# Patient Record
Sex: Male | Born: 1965
Health system: Southern US, Community
[De-identification: ages and names within clinical notes are randomized; demographics above are authoritative.]

## PROBLEM LIST (undated history)

## (undated) DIAGNOSIS — C4492 Squamous cell carcinoma of skin, unspecified: Secondary | ICD-10-CM

## (undated) DIAGNOSIS — L570 Actinic keratosis: Secondary | ICD-10-CM

## (undated) DIAGNOSIS — N200 Calculus of kidney: Secondary | ICD-10-CM

## (undated) DIAGNOSIS — K219 Gastro-esophageal reflux disease without esophagitis: Secondary | ICD-10-CM

## (undated) DIAGNOSIS — G4730 Sleep apnea, unspecified: Secondary | ICD-10-CM

## (undated) DIAGNOSIS — G4733 Obstructive sleep apnea (adult) (pediatric): Secondary | ICD-10-CM

## (undated) DIAGNOSIS — R12 Heartburn: Secondary | ICD-10-CM

## (undated) HISTORY — DX: Actinic keratosis: L57.0

## (undated) HISTORY — DX: Heartburn: R12

## (undated) HISTORY — DX: Gastro-esophageal reflux disease without esophagitis: K21.9

## (undated) HISTORY — PX: UPPER GI ENDOSCOPY: SHX6162

## (undated) HISTORY — DX: Squamous cell carcinoma of skin, unspecified: C44.92

## (undated) HISTORY — PX: COLONOSCOPY: SHX5424

## (undated) HISTORY — DX: Sleep apnea, unspecified: G47.30

## (undated) HISTORY — PX: OTHER SURGICAL HISTORY: SHX169

## (undated) HISTORY — PX: CHOLECYSTECTOMY: SHX55

## (undated) HISTORY — DX: Obstructive sleep apnea (adult) (pediatric): G47.33

## (undated) HISTORY — DX: Calculus of kidney: N20.0

---

## 2003-12-12 ENCOUNTER — Encounter: Payer: Self-pay | Admitting: Family Medicine

## 2003-12-12 LAB — CONVERTED CEMR LAB
Blood Glucose, Fasting: 91 mg/dL
RBC count: 5.61 10*6/uL

## 2004-12-18 ENCOUNTER — Ambulatory Visit: Payer: Self-pay | Admitting: Family Medicine

## 2005-11-17 ENCOUNTER — Ambulatory Visit: Payer: Self-pay | Admitting: Family Medicine

## 2006-05-19 ENCOUNTER — Ambulatory Visit: Payer: Self-pay | Admitting: Family Medicine

## 2007-07-29 ENCOUNTER — Ambulatory Visit: Payer: Self-pay | Admitting: Family Medicine

## 2007-07-29 DIAGNOSIS — E782 Mixed hyperlipidemia: Secondary | ICD-10-CM | POA: Insufficient documentation

## 2007-07-29 DIAGNOSIS — K219 Gastro-esophageal reflux disease without esophagitis: Secondary | ICD-10-CM | POA: Insufficient documentation

## 2007-08-04 ENCOUNTER — Encounter: Payer: Self-pay | Admitting: Family Medicine

## 2007-09-09 ENCOUNTER — Ambulatory Visit: Payer: Self-pay | Admitting: Family Medicine

## 2007-09-27 ENCOUNTER — Ambulatory Visit: Payer: Self-pay | Admitting: Family Medicine

## 2007-09-27 DIAGNOSIS — B029 Zoster without complications: Secondary | ICD-10-CM | POA: Insufficient documentation

## 2007-09-27 DIAGNOSIS — L821 Other seborrheic keratosis: Secondary | ICD-10-CM | POA: Insufficient documentation

## 2007-10-06 ENCOUNTER — Ambulatory Visit: Payer: Self-pay | Admitting: Family Medicine

## 2007-10-06 DIAGNOSIS — K645 Perianal venous thrombosis: Secondary | ICD-10-CM | POA: Insufficient documentation

## 2008-05-03 ENCOUNTER — Ambulatory Visit: Payer: Self-pay | Admitting: Family Medicine

## 2008-05-03 DIAGNOSIS — J069 Acute upper respiratory infection, unspecified: Secondary | ICD-10-CM | POA: Insufficient documentation

## 2008-05-03 DIAGNOSIS — K120 Recurrent oral aphthae: Secondary | ICD-10-CM | POA: Insufficient documentation

## 2008-05-17 ENCOUNTER — Telehealth: Payer: Self-pay | Admitting: Family Medicine

## 2009-12-10 ENCOUNTER — Ambulatory Visit: Payer: Self-pay | Admitting: Family Medicine

## 2009-12-10 DIAGNOSIS — R82998 Other abnormal findings in urine: Secondary | ICD-10-CM | POA: Insufficient documentation

## 2009-12-10 LAB — CONVERTED CEMR LAB
Bilirubin Urine: NEGATIVE
Blood in Urine, dipstick: NEGATIVE
Ketones, urine, test strip: NEGATIVE
Nitrite: NEGATIVE
pH: 5

## 2009-12-11 ENCOUNTER — Encounter: Payer: Self-pay | Admitting: Family Medicine

## 2010-01-08 ENCOUNTER — Encounter (INDEPENDENT_AMBULATORY_CARE_PROVIDER_SITE_OTHER): Payer: Self-pay | Admitting: *Deleted

## 2010-07-02 NOTE — Assessment & Plan Note (Signed)
Summary: TRANSFER FROM DR SCHALLER/BLOOD IN   Vital Signs:  Patient profile:   45 year old male Height:      74 inches Weight:      245.75 pounds BMI:     31.67 Temp:     98.3 degrees F oral Pulse rate:   80 / minute Pulse rhythm:   regular BP sitting:   112 / 88  (left arm) Cuff size:   large  Vitals Entered By: Delilah Shan CMA Khamya Topp Dull) (12/13/09 3:53 PM) CC: Transfer from [place] - Blood in urine   History of Present Illness: Had DOT physical and was fine except for small amount of protein and blood in urine.  Had been having some diarrhea (wife had similar).   Feeling well o/w.  No gross hematuria.  No fevers, chills, vomiting.  No h/o kidney stones.  No other complaints.  Multiple prev DOT physicals w/o any abnormality on lab testing.   Allergies: No Known Drug Allergies  Past History:  Family History: Last updated: 12-13-09 Father: Dead MI X68 ; +HTN, CHF Mother: Alive Siblings: 2 BROTHERS 1 SISTER CV: + FATHER MI X2 HBP: + FATHER DM: NEGATIVE GOUT/ARTHRITIS: PROSTATE CANCER:  BREAST/OVARIAN/UTERINE CANCER: COLON CANCER: DEPRESSION: + BROTHER ETOH/DRUG ABUSE: NEGATIVE OTHER: + STROKE IN FAMILY  Social History: Last updated: 12/13/2009 Marital Status: Married// REMARRIED LIVES WITH WIFE Children: 1CHILD FROM PREVIOUS MARRIAGE LIVES WITH MOTHER Occupation: TRUCKDRIVER Stopped smoking 2007 Occ alcohol on weekend   Risk Factors: Smoking Status: quit (07/29/2007) Packs/Day: 05/2007 (08/04/2007)  Past Medical History: Heartburn, treated with ranitidine  Family History: Father: Dead MI X74 ; +HTN, CHF Mother: Alive Siblings: 2 BROTHERS 1 SISTER CV: + FATHER MI X2 HBP: + FATHER DM: NEGATIVE GOUT/ARTHRITIS: PROSTATE CANCER:  BREAST/OVARIAN/UTERINE CANCER: COLON CANCER: DEPRESSION: + BROTHER ETOH/DRUG ABUSE: NEGATIVE OTHER: + STROKE IN FAMILY  Social History: Marital Status: Married// REMARRIED LIVES WITH WIFE Children: 1CHILD FROM PREVIOUS  MARRIAGE LIVES WITH MOTHER Occupation: TRUCKDRIVER Stopped smoking 2007 Occ alcohol on weekend   Review of Systems       See HPI.  Otherwise noncontributory.    Physical Exam  General:  GEN: nad, alert and oriented HEENT: mucous membranes moist NECK: supple w/o LA CV: rrr.  no murmur PULM: ctab, no inc wob ABD: soft, +bs EXT: no edema SKIN: no acute rash    Impression & Recommendations:  Problem # 1:  OTHER NONSPECIFIC FINDING EXAMINATION OF URINE (ICD-791.9) Likely false positive.  Check urine cx and contact with results.  If +, treat.  If neg, no other work up needed.  D/w patient.  Ua reviewed.  No h/o DM, no reason to suspected other systemic disease.  Orders: T-Urine Culture (Spectrum Order) [id number]) UA Dipstick w/o Micro (manual) (09811) Specimen Handling (99000)  Complete Medication List: 1)  Zantac 150 Mg Tabs (Ranitidine hcl) .Marland Kitchen.. 1 tablet two times a day  Patient Instructions: 1)  Please schedule a follow-up appointment as needed .  I'll send word about your urine culture.   Current Allergies (reviewed today): No known allergies   Laboratory Results   Urine Tests  Date/Time Received: December 13, 2009 4:07 PM   Routine Urinalysis   Color: yellow Appearance: Clear Glucose: negative   (Normal Range: Negative) Bilirubin: negative   (Normal Range: Negative) Ketone: negative   (Normal Range: Negative) Spec. Gravity: >=1.030   (Normal Range: 1.003-1.035) Blood: negative   (Normal Range: Negative) pH: 5.0   (Normal Range: 5.0-8.0) Protein: negative   (Normal Range:  Negative) Urobilinogen: 0.2   (Normal Range: 0-1) Nitrite: negative   (Normal Range: Negative) Leukocyte Esterace: negative   (Normal Range: Negative)

## 2010-07-02 NOTE — Letter (Signed)
Summary: Gregory Valencia letter  [place] [facility]  [address] [place], [place] [address]   Phone: [phone]  Fax: [phone]       01/08/2010 MRN: [id number]  [facility] [address] [place], [place]  [address]  Dear Gregory Valencia,  [facility] [place], and [facility] announce the retirement of Gregory Valencia, M.D., from full-time practice at the [facility] office effective November 29, 2009 and his plans of returning part-time.  It is important to Gregory Valencia and to our practice that you understand that [facility] has seven physicians in our office for your health care needs.  We will continue to offer the same exceptional care that you have today.    Gregory Valencia has spoken to many of you about his plans for retirement and returning part-time in the fall.   We will continue to work with you through the transition to schedule appointments for you in the office and meet the high standards that [facility] is committed to.   Again, it is with great pleasure that we share the news that Gregory Valencia will return to [facility] at [facility] in October of 2011 with a reduced schedule.    If you have any questions, or would like to request an appointment with one of our physicians, please call us at [phone] and press the option for Scheduling an appointment.  We take pleasure in providing you with excellent patient care and look forward to seeing you at your next office visit.  Our [facility] Physicians are:  Gregory Valencia, M.D. Gregory Valencia, M.D. Gregory Valencia, M.D. Gregory Valencia, M.D. Gregory Valencia, M.D. Gregory Valencia, M.D. We proudly welcomed Gregory Valencia, M.D. and Gregory Valencia, M.D. to the practice in July/August 2011.  Sincerely,  [facility] [place] [facility]

## 2011-04-09 ENCOUNTER — Encounter: Payer: Self-pay | Admitting: Family Medicine

## 2011-04-09 ENCOUNTER — Ambulatory Visit (INDEPENDENT_AMBULATORY_CARE_PROVIDER_SITE_OTHER): Payer: BC Managed Care – PPO | Admitting: Family Medicine

## 2011-04-09 DIAGNOSIS — J069 Acute upper respiratory infection, unspecified: Secondary | ICD-10-CM

## 2011-04-09 DIAGNOSIS — L723 Sebaceous cyst: Secondary | ICD-10-CM

## 2011-04-09 NOTE — Assessment & Plan Note (Signed)
Recurrent, poss early flu. See instructions.

## 2011-04-09 NOTE — Patient Instructions (Signed)
Take Guaifenesin (400mg ), take 11/2 tabs by mouth AM and NOON. Get GUAIFENESIN by  going to [facility] or [facility] and getting MUCOUS RELIEF EXPECTORANT/CONGESTION. DO NOT GET MUCINEX (Timed Release Guaifenesin) Drink lots of fluids, clears best. Take Tyl 500mg  2 three times a day. Keep lozenge in mouth all day. BRAT today and tomorrow.

## 2011-04-09 NOTE — Assessment & Plan Note (Signed)
Chronic of back that has been there for years. Is slowly getting larger. He wants rtemoved. Will try to do it here in the office. Is approx 3-4 cm in diam.

## 2011-04-09 NOTE — Progress Notes (Signed)
  Subjective:    Patient ID: Gregory Valencia, male    DOB: 1966/03/29, 45 y.o.   MRN: [id number]  HPI Pt of mine not seen very often here today as acute appt for URI. He has had fever to 101.5 this AM and he has clod sweats. He has hewadache in the frontal distribution. No ear pain, noST, no rhinitis, chest congestion with cough occasionally thsa tgetds bad episodically that is poorly productive with yellow mucous. He ate at a [employer] last night for soup which was mostly fat and he finally threw up. He had grits.     Review of SystemsNoncontributory except as above.       Objective:   Physical Exam  Constitutional: He is oriented to person, place, and time. He appears well-developed and well-nourished. No distress.       Mildly lethargic.  HENT:  Head: Normocephalic and atraumatic.  Right Ear: External ear normal.  Left Ear: External ear normal.  Nose: Nose normal.  Mouth/Throat: Oropharynx is clear and moist.  Eyes: Conjunctivae and EOM are normal. Pupils are equal, round, and reactive to light. Right eye exhibits no discharge. Left eye exhibits no discharge.  Neck: Normal range of motion. Neck supple.  Cardiovascular: Normal rate and regular rhythm.   Pulmonary/Chest: Effort normal and breath sounds normal. He has no wheezes.  Abdominal: Soft. Bowel sounds are normal. He exhibits no distension. There is no tenderness. There is no rebound and no guarding.  Lymphadenopathy:    He has no cervical adenopathy.  Neurological: He is alert and oriented to person, place, and time. He has normal reflexes. No cranial nerve deficit. He exhibits normal muscle tone. Coordination normal.  Skin: He is not diaphoretic.          Assessment & Plan:

## 2011-04-11 ENCOUNTER — Ambulatory Visit (INDEPENDENT_AMBULATORY_CARE_PROVIDER_SITE_OTHER): Payer: PRIVATE HEALTH INSURANCE | Admitting: Family Medicine

## 2011-04-11 ENCOUNTER — Encounter: Payer: Self-pay | Admitting: Family Medicine

## 2011-04-11 DIAGNOSIS — R1110 Vomiting, unspecified: Secondary | ICD-10-CM

## 2011-04-11 MED ORDER — ONDANSETRON HCL 4 MG PO TABS: 4.0000 mg | ORAL_TABLET | Freq: Three times a day (TID) | ORAL | Status: AC | PRN

## 2011-04-11 MED ORDER — PROMETHAZINE HCL 25 MG RE SUPP: 25.0000 mg | Freq: Four times a day (QID) | RECTAL | Status: AC | PRN

## 2011-04-11 NOTE — Progress Notes (Signed)
Started with aches on Sunday.  Increased through the week.  Fatigue and now with vomiting x36 hours.  No blood in vomitus.  Initially clear, then brown, then yellow/green.  Some loose stools earlier.  Dec in appetite.  Taking OTC cold meds.  HA for 2-3 days, but no ear pain.  Fever up to 102.  No sick contacts.  Trying to take sips of liquids.  Last UOP this AM.    Meds, vitals, and allergies reviewed.   ROS: See HPI.  Otherwise, noncontributory.  Nad, nontoxic, but looks like he doesn't feel well, ie mildly ill ncat  Mmm, no oral lesions rrr ctab abd soft, not ttp, no masses, no rebound Ext well perfused.

## 2011-04-11 NOTE — Patient Instructions (Signed)
Take the zofran 3 times a day along with sips of liquids.  If you get dizzy on standing or stop making urine, then go to the ER.  Use the phenergan suppository if you can't hold the zofran down.

## 2011-04-13 ENCOUNTER — Encounter: Payer: Self-pay | Admitting: Family Medicine

## 2011-04-13 DIAGNOSIS — R1110 Vomiting, unspecified: Secondary | ICD-10-CM | POA: Insufficient documentation

## 2011-04-13 NOTE — Assessment & Plan Note (Signed)
Nontoxic, not dehydrated, okay for outpatient f/u.  Take sips of liquids, use zofran prn and phenergan suppositories if he can't tolerate oral zofran.  Likely with acute viral process and no need for further w/u at this point.  ddx d/w pt and okay for prn f/u.  He agrees.  Notify MD if dehydrated, blood in stool/vomitus, other concerns.

## 2011-10-17 ENCOUNTER — Encounter: Payer: Self-pay | Admitting: Family Medicine

## 2011-10-17 ENCOUNTER — Ambulatory Visit (INDEPENDENT_AMBULATORY_CARE_PROVIDER_SITE_OTHER): Payer: PRIVATE HEALTH INSURANCE | Admitting: Family Medicine

## 2011-10-17 VITALS — BP 114/74 | HR 72 | Temp 98.4°F | Wt 246.8 lb

## 2011-10-17 DIAGNOSIS — L723 Sebaceous cyst: Secondary | ICD-10-CM

## 2011-10-17 NOTE — Progress Notes (Signed)
"  spots on my back."  3 cm lesion.  Puffy and tender.  Has enlarged per patient. Asking for eval tx.    I&D  Meds, vitals, and allergies reviewed.   Indication: sebaceous cyst, symptomatic  Pt complaints of: pain, swelling  Location: back  Size:3 cm  Informed consent obtained.  Pt aware of risks not limited to but including infection, bleeding, damage to near by organs.  See scanned forms.   Prep: etoh/betadine  Anesthesia: 1%lidocaine with epi, good effect  Incision made with #11 blade  Cyst explored and loculations removed, sebum expressed, fragments of sac removed with forceps.   Wound packed with iodoform gauze  Tolerated well  Routine postprocedure instructions d/w pt- remove packing gradually, keep area clean and bandaged, follow up if concerns/spreading erythema/pain.

## 2011-10-17 NOTE — Patient Instructions (Signed)
Keep the area clean and covered.  Pull some of the packing each day and trim the end.  It should gradually heal.  Take care.

## 2011-10-19 NOTE — Assessment & Plan Note (Signed)
I&D done, tolerated well, see above.  See instructions.

## 2011-12-11 ENCOUNTER — Encounter: Payer: Self-pay | Admitting: Family Medicine

## 2011-12-11 ENCOUNTER — Ambulatory Visit (INDEPENDENT_AMBULATORY_CARE_PROVIDER_SITE_OTHER): Payer: PRIVATE HEALTH INSURANCE | Admitting: Family Medicine

## 2011-12-11 VITALS — BP 120/82 | HR 64 | Temp 98.3°F | Ht 73.5 in | Wt 247.0 lb

## 2011-12-11 DIAGNOSIS — T148XXA Other injury of unspecified body region, initial encounter: Secondary | ICD-10-CM

## 2011-12-11 DIAGNOSIS — T148 Other injury of unspecified body region: Secondary | ICD-10-CM

## 2011-12-11 DIAGNOSIS — W57XXXA Bitten or stung by nonvenomous insect and other nonvenomous arthropods, initial encounter: Secondary | ICD-10-CM | POA: Insufficient documentation

## 2011-12-11 MED ORDER — DOXYCYCLINE HYCLATE 100 MG PO TABS: 100.0000 mg | ORAL_TABLET | Freq: Two times a day (BID) | ORAL | Status: AC

## 2011-12-11 NOTE — Assessment & Plan Note (Signed)
Given the duration and engorged tick, would treat with doxy.  Nontoxic, no systemic sx.  F/u prn.  He agrees, will call back as needed.

## 2011-12-11 NOTE — Progress Notes (Signed)
Engorged tick on R lower abd.  It likely bit him twice, had moved from initial site to a lower site.  Removed, local redness noted in last 48 hours. Tick was on for about ~5 days.  No FCNAVD.  No rash, other than local bite reaction.    Meds, vitals, and allergies reviewed.   ROS: See HPI.  Otherwise, noncontributory.  nad rrr ctab No FB noted, no retained parts noted Local erythema around both bite sites.

## 2011-12-11 NOTE — Patient Instructions (Addendum)
Start the doxycycline today and be careful about sun exposure.

## 2013-06-02 DIAGNOSIS — C4492 Squamous cell carcinoma of skin, unspecified: Secondary | ICD-10-CM

## 2013-06-02 HISTORY — DX: Squamous cell carcinoma of skin, unspecified: C44.92

## 2013-08-03 ENCOUNTER — Encounter: Payer: Self-pay | Admitting: Family Medicine

## 2013-08-03 ENCOUNTER — Ambulatory Visit (INDEPENDENT_AMBULATORY_CARE_PROVIDER_SITE_OTHER): Payer: PRIVATE HEALTH INSURANCE | Admitting: Family Medicine

## 2013-08-03 VITALS — BP 114/78 | HR 64 | Temp 97.9°F | Wt 248.8 lb

## 2013-08-03 DIAGNOSIS — L989 Disorder of the skin and subcutaneous tissue, unspecified: Secondary | ICD-10-CM

## 2013-08-03 NOTE — Patient Instructions (Signed)
No charge for visit.  If you had a copay, then get it back.

## 2013-08-03 NOTE — Progress Notes (Signed)
Pre visit review using our clinic review tool, if applicable. No additional management support is needed unless otherwise documented below in the visit note.  Wanted 2 skin lesions evaluated.  Both on L leg.   Meds, vitals, and allergies reviewed.   ROS: See HPI.  Otherwise, noncontributory.  nad L lateral thigh with 1.5x1.5cm round hyperkeratotic lesion.  No ulceration.  L posterior thigh with skin tag noted.

## 2013-08-04 DIAGNOSIS — L989 Disorder of the skin and subcutaneous tissue, unspecified: Secondary | ICD-10-CM | POA: Insufficient documentation

## 2013-08-04 NOTE — Assessment & Plan Note (Signed)
Refer to derm for excision of the larger lesion.  D/w pt.  He agrees. I can take off the skin tag if derm doesn't do it at the same time.  He agrees. No charge.

## 2014-04-12 ENCOUNTER — Telehealth: Payer: Self-pay | Admitting: Family Medicine

## 2014-04-12 NOTE — Telephone Encounter (Signed)
I can refer him but we should probably get him in at [facility] before we send him to GI. Try to schedule appointment.

## 2014-04-12 NOTE — Telephone Encounter (Signed)
Patient notified as instructed by telephone. Appointment scheduled for 04/14/14.

## 2014-04-12 NOTE — Telephone Encounter (Signed)
Pt is requesting a referral to GI doctor for the reflux that he is having Pt is requesting a call back.

## 2014-04-14 ENCOUNTER — Ambulatory Visit (INDEPENDENT_AMBULATORY_CARE_PROVIDER_SITE_OTHER): Payer: PRIVATE HEALTH INSURANCE | Admitting: Family Medicine

## 2014-04-14 ENCOUNTER — Encounter: Payer: Self-pay | Admitting: Family Medicine

## 2014-04-14 VITALS — BP 122/76 | HR 68 | Temp 98.6°F | Wt 250.8 lb

## 2014-04-14 DIAGNOSIS — R112 Nausea with vomiting, unspecified: Secondary | ICD-10-CM

## 2014-04-14 DIAGNOSIS — M542 Cervicalgia: Secondary | ICD-10-CM

## 2014-04-14 LAB — CBC WITH DIFFERENTIAL/PLATELET
BASOS ABS: 0 10*3/uL (ref 0.0–0.1)
Basophils Relative: 0 % (ref 0–1)
EOS ABS: 0.2 10*3/uL (ref 0.0–0.7)
EOS PCT: 2 % (ref 0–5)
HEMATOCRIT: 44.8 % (ref 39.0–52.0)
Hemoglobin: 15.8 g/dL (ref 13.0–17.0)
LYMPHS PCT: 34 % (ref 12–46)
Lymphs Abs: 3.2 10*3/uL (ref 0.7–4.0)
MCH: 29.1 pg (ref 26.0–34.0)
MCHC: 35.3 g/dL (ref 30.0–36.0)
MCV: 82.5 fL (ref 78.0–100.0)
MONO ABS: 0.8 10*3/uL (ref 0.1–1.0)
Monocytes Relative: 9 % (ref 3–12)
Neutro Abs: 5.1 10*3/uL (ref 1.7–7.7)
Neutrophils Relative %: 55 % (ref 43–77)
PLATELETS: 249 10*3/uL (ref 150–400)
RBC: 5.43 MIL/uL (ref 4.22–5.81)
RDW: 14 % (ref 11.5–15.5)
WBC: 9.3 10*3/uL (ref 4.0–10.5)

## 2014-04-14 MED ORDER — CYCLOBENZAPRINE HCL 10 MG PO TABS: 5.0000 mg | ORAL_TABLET | Freq: Three times a day (TID) | ORAL | Status: DC | PRN

## 2014-04-14 NOTE — Patient Instructions (Addendum)
Gregory Valencia will call about your referral for the ultrasound and the GI appointment.  Go to the lab on the way out.  We'll contact you with your lab report. Avoid greasy food in the meantime.  Use a heating pad on your neck and gently stretch.  Use flexeril if needed.  If can make you drowsy.

## 2014-04-14 NOTE — Progress Notes (Signed)
Pre visit review using our clinic review tool, if applicable. No additional management support is needed unless otherwise documented below in the visit note.  GI sx- ad been on prilosec for about 1 year, more recently changed to nexium.   He has had irregular episodes w/o a clear trigger.  He'll feel fine then he'll get tightness in his chest along with stomach cramps. He'll have to make himself vomit.  He'll tense up and can't have a BM or pass gas, can't burp during an episode.    When he vomits, it will be prev food, then yellowish mucous.   This has been going on "for a long time (ie ~10+ years)".  He can go months between episodes.   He can't always find a trigger for the episodes.    Rare nsaid use.   He never had an endoscopy previously.  No blood in stool but his stools seem be abnormally dark after the episodes.   No BRBPR.  No h/o jaundice.    His mother has a history of similar episodes and she improved after her GB was removed.  No FH colon cancer.  Unclear if greasy food is the trigger.    Neck/R trap pain.  He has been rotating pillows, changed to a softer pillow recently.  No trauma.  Pain along the R occiput and R posterior neck.   Lightheaded very briefly when getting out of bed.  Doesn't happen with laying down or with rolling over.  No sx with head turning.  No syncope.  No sx with eye tracking.   Meds, vitals, and allergies reviewed.   ROS: See HPI.  Otherwise, noncontributory.  GEN: nad, alert and oriented HEENT: mucous membranes moist, tm wnl, nasal and OP exam wnl NECK: supple w/o LA, no midline pain. R occiput and R trap ttp CV: rrr. PULM: ctab, no inc wob ABD: soft, +bs EXT: no edema SKIN: no acute rash CN 2-12 wnl B, S/S/DTR wnl x4

## 2014-04-15 LAB — COMPREHENSIVE METABOLIC PANEL
ALK PHOS: 65 U/L (ref 39–117)
ALT: 28 U/L (ref 0–53)
AST: 31 U/L (ref 0–37)
Albumin: 4.3 g/dL (ref 3.5–5.2)
BILIRUBIN TOTAL: 0.5 mg/dL (ref 0.2–1.2)
BUN: 14 mg/dL (ref 6–23)
CO2: 26 mEq/L (ref 19–32)
CREATININE: 1.34 mg/dL (ref 0.50–1.35)
Calcium: 9.7 mg/dL (ref 8.4–10.5)
Chloride: 104 mEq/L (ref 96–112)
Glucose, Bld: 99 mg/dL (ref 70–99)
Potassium: 4.4 mEq/L (ref 3.5–5.3)
SODIUM: 141 meq/L (ref 135–145)
TOTAL PROTEIN: 6.8 g/dL (ref 6.0–8.3)

## 2014-04-16 DIAGNOSIS — M542 Cervicalgia: Secondary | ICD-10-CM | POA: Insufficient documentation

## 2014-04-16 DIAGNOSIS — R1110 Vomiting, unspecified: Secondary | ICD-10-CM | POA: Insufficient documentation

## 2014-04-16 NOTE — Assessment & Plan Note (Signed)
Atypical episodes, will check basic labs, get abd u/s, and refer to GI.  Even if the episodes are GB related and would be addressed by gen surgery, he has the atypical dark stools and is age 48, so would need GI eval anyway.  D/w pt.  He agrees.  Normal exam today, with sx ongoing for 10 years he is okay for outpatient f/u.  The lightheadedness isn't vertigo and I advised him to take care with position change and stay well hydrated.   >25 minutes spent in face to face time with patient, >50% spent in counselling or coordination of care.

## 2014-04-16 NOTE — Assessment & Plan Note (Signed)
Likely benign msk strain, d/w pt about stretching, heat, etc. F/u prn.

## 2014-04-19 ENCOUNTER — Encounter: Payer: Self-pay | Admitting: Physician Assistant

## 2014-05-01 ENCOUNTER — Ambulatory Visit
Admission: RE | Admit: 2014-05-01 | Discharge: 2014-05-01 | Disposition: A | Discharge status: Home / Self Care | Payer: PRIVATE HEALTH INSURANCE | Source: Ambulatory Visit | Attending: Family Medicine | Admitting: Family Medicine

## 2014-05-01 ENCOUNTER — Other Ambulatory Visit: Payer: Self-pay | Admitting: Family Medicine

## 2014-05-01 DIAGNOSIS — K8020 Calculus of gallbladder without cholecystitis without obstruction: Secondary | ICD-10-CM

## 2014-05-01 DIAGNOSIS — R112 Nausea with vomiting, unspecified: Secondary | ICD-10-CM

## 2014-05-03 ENCOUNTER — Ambulatory Visit (INDEPENDENT_AMBULATORY_CARE_PROVIDER_SITE_OTHER): Payer: PRIVATE HEALTH INSURANCE | Admitting: Physician Assistant

## 2014-05-03 ENCOUNTER — Encounter: Payer: Self-pay | Admitting: Physician Assistant

## 2014-05-03 ENCOUNTER — Other Ambulatory Visit (INDEPENDENT_AMBULATORY_CARE_PROVIDER_SITE_OTHER): Payer: PRIVATE HEALTH INSURANCE

## 2014-05-03 VITALS — BP 120/76 | HR 64 | Ht 73.25 in | Wt 251.4 lb

## 2014-05-03 DIAGNOSIS — R1013 Epigastric pain: Secondary | ICD-10-CM

## 2014-05-03 DIAGNOSIS — K219 Gastro-esophageal reflux disease without esophagitis: Secondary | ICD-10-CM

## 2014-05-03 DIAGNOSIS — R197 Diarrhea, unspecified: Secondary | ICD-10-CM

## 2014-05-03 LAB — AMYLASE: Amylase: 48 U/L (ref 27–131)

## 2014-05-03 LAB — LIPASE: Lipase: 32 U/L (ref 11.0–59.0)

## 2014-05-03 MED ORDER — PANTOPRAZOLE SODIUM 40 MG PO TBEC: 40.0000 mg | DELAYED_RELEASE_TABLET | ORAL | Status: DC

## 2014-05-03 MED ORDER — METOCLOPRAMIDE HCL 10 MG PO TABS: 10.0000 mg | ORAL_TABLET | Freq: Every day | ORAL | Status: DC

## 2014-05-03 MED ORDER — MOVIPREP 100 G PO SOLR: 1.0000 | Freq: Once | ORAL | Status: DC

## 2014-05-03 MED ORDER — HYOSCYAMINE SULFATE 0.125 MG SL SUBL: 0.1250 mg | SUBLINGUAL_TABLET | Freq: Two times a day (BID) | SUBLINGUAL | Status: DC | PRN

## 2014-05-03 NOTE — Progress Notes (Signed)
Reviewed and agree with management plan.  Malcolm T. Stark, MD FACG 

## 2014-05-03 NOTE — Patient Instructions (Addendum)
You have been scheduled for an endoscopy and colonoscopy. Please follow the written instructions given to you at your visit today. Please pick up your prep at the pharmacy within the next 1-3 days. If you use inhalers (even only as needed), please bring them with you on the day of your procedure. Your physician has requested that you go to www.startemmi.com and enter the access code given to you at your visit today. This web site gives a general overview about your procedure. However, you should still follow specific instructions given to you by our office regarding your preparation for the procedure.  Your physician has requested that you go to the basement for the following lab work before leaving today: Amylase, Lipase, Celiac Panel, hepatic panel  Please discontinue omeprazole.  We have sent the following medications to your pharmacy for you to pick up at your convenience: Pantoprazole Reglan Levsin ______________________________________________________________________________________________________________________________________________________________________________________________ Food Choices for Gastroesophageal Reflux Disease When you have gastroesophageal reflux disease (GERD), the foods you eat and your eating habits are very important. Choosing the right foods can help ease the discomfort of GERD. WHAT GENERAL GUIDELINES DO I NEED TO FOLLOW?  Choose fruits, vegetables, whole grains, low-fat dairy products, and low-fat meat, fish, and poultry.  Limit fats such as oils, salad dressings, butter, nuts, and avocado.  Keep a food diary to identify foods that cause symptoms.  Avoid foods that cause reflux. These may be different for different people.  Eat frequent small meals instead of three large meals each day.  Eat your meals slowly, in a relaxed setting.  Limit fried foods.  Cook foods using methods other than frying.  Avoid drinking alcohol.  Avoid drinking large  amounts of liquids with your meals.  Avoid bending over or lying down until 2-3 hours after eating. WHAT FOODS ARE NOT RECOMMENDED? The following are some foods and drinks that may worsen your symptoms: Vegetables Tomatoes. Tomato juice. Tomato and spaghetti sauce. Chili peppers. Onion and garlic. Horseradish. Fruits Oranges, grapefruit, and lemon (fruit and juice). Meats High-fat meats, fish, and poultry. This includes hot dogs, ribs, ham, sausage, salami, and bacon. Dairy Whole milk and chocolate milk. Sour cream. Cream. Butter. Ice cream. Cream cheese.  Beverages Coffee and tea, with or without caffeine. Carbonated beverages or energy drinks. Condiments Hot sauce. Barbecue sauce.  Sweets/Desserts Chocolate and cocoa. Donuts. Peppermint and spearmint. Fats and Oils High-fat foods, including [place] fries and potato chips. Other Vinegar. Strong spices, such as black pepper, white pepper, red pepper, cayenne, curry powder, cloves, ginger, and chili powder. The items listed above may not be a complete list of foods and beverages to avoid. Contact your dietitian for more information. Document Released: 05/19/2005 Document Revised: 05/24/2013 Document Reviewed: 03/23/2013 [facility] Patient Information 2015 [place], [place]. This information is not intended to replace advice given to you by your health care provider. Make sure you discuss any questions you have with your health care provider. _____________________________________________________________________________________________________________________________________________________________________________________________________ Gastroesophageal Reflux Disease, Adult Gastroesophageal reflux disease (GERD) happens when acid from your stomach flows up into the esophagus. When acid comes in contact with the esophagus, the acid causes soreness (inflammation) in the esophagus. Over time, GERD may create small holes (ulcers) in the lining of  the esophagus. CAUSES   Increased body weight. This puts pressure on the stomach, making acid rise from the stomach into the esophagus.  Smoking. This increases acid production in the stomach.  Drinking alcohol. This causes decreased pressure in the lower esophageal sphincter (valve or ring of muscle  between the esophagus and stomach), allowing acid from the stomach into the esophagus.  Late evening meals and a full stomach. This increases pressure and acid production in the stomach.  A malformed lower esophageal sphincter. Sometimes, no cause is found. SYMPTOMS   Burning pain in the lower part of the mid-chest behind the breastbone and in the mid-stomach area. This may occur twice a week or more often.  Trouble swallowing.  Sore throat.  Dry cough.  Asthma-like symptoms including chest tightness, shortness of breath, or wheezing. DIAGNOSIS  Your caregiver may be able to diagnose GERD based on your symptoms. In some cases, X-rays and other tests may be done to check for complications or to check the condition of your stomach and esophagus. TREATMENT  Your caregiver may recommend over-the-counter or prescription medicines to help decrease acid production. Ask your caregiver before starting or adding any new medicines.  HOME CARE INSTRUCTIONS   Change the factors that you can control. Ask your caregiver for guidance concerning weight loss, quitting smoking, and alcohol consumption.  Avoid foods and drinks that make your symptoms worse, such as:  Caffeine or alcoholic drinks.  Chocolate.  Peppermint or mint flavorings.  Garlic and onions.  Spicy foods.  Citrus fruits, such as oranges, lemons, or limes.  Tomato-based foods such as sauce, chili, salsa, and pizza.  Fried and fatty foods.  Avoid lying down for the 3 hours prior to your bedtime or prior to taking a nap.  Eat small, frequent meals instead of large meals.  Wear loose-fitting clothing. Do not wear anything  tight around your waist that causes pressure on your stomach.  Raise the head of your bed 6 to 8 inches with wood blocks to help you sleep. Extra pillows will not help.  Only take over-the-counter or prescription medicines for pain, discomfort, or fever as directed by your caregiver.  Do not take aspirin, ibuprofen, or other nonsteroidal anti-inflammatory drugs (NSAIDs). SEEK IMMEDIATE MEDICAL CARE IF:   You have pain in your arms, neck, jaw, teeth, or back.  Your pain increases or changes in intensity or duration.  You develop nausea, vomiting, or sweating (diaphoresis).  You develop shortness of breath, or you faint.  Your vomit is green, yellow, black, or looks like coffee grounds or blood.  Your stool is red, bloody, or black. These symptoms could be signs of other problems, such as heart disease, gastric bleeding, or esophageal bleeding. MAKE SURE YOU:   Understand these instructions.  Will watch your condition.  Will get help right away if you are not doing well or get worse. Document Released: 02/26/2005 Document Revised: 08/11/2011 Document Reviewed: 12/06/2010 [facility] Patient Information 2015 [place], [place]. This information is not intended to replace advice given to you by your health care provider. Make sure you discuss any questions you have with your health care provider.

## 2014-05-03 NOTE — Progress Notes (Signed)
Patient ID: Gregory Valencia, male   DOB: November 17, 1965, 48 y.o.   MRN: [id number]    HPI: Gregory Valencia is a 48 year old male referred for evaluation by Dr. Damita Dunnings due to reflux and diarrhea.  Look says he has had trouble with reflux for several years. He reports that for 12-15 years he has been getting episodes of extreme postprandial diffuse abdominal cramping associated with nausea and diarrhea. When this initially started 12 or 15 years ago, it typically occurred every 5 or 6 months. Since September 2015 he has had these episodes several times per week. His pain and cramping usually occur midday. He will get epigastric pain that radiates to the lower abdomen and around to the back with cramping. He feels unable to burp or pass gas. He has been making himself vomit and usually brings up undigested food several hours after he has eaten his meal. He has a long history of heartburn. Initially he tried Nexium and had no relief, and so several months ago he was switched to OTC omeprazole. He takes this in the morning, but by mid afternoon gets heartburn. He also feels that whenever he burps he gets mouthfuls of better acid coming into his throat. He has no early satiety, but feels as if his food does not move out of his stomach and repeats on him for several hours after meals. He had an abdominal ultrasound on November 30 of 2015 that showed cholelithiasis without evidence of acute cholecystitis. The common bile duct was 2.5 mm. No acute abnormality was demonstrated elsewhere within the abdomen. He had blood work November 15 that revealed normal liver enzymes, and a normal CBC.  He reports that years ago his stools were fairly formed but since these episodes of abdominal cramping started 12-15 years ago his stools have been mushy. He will often times have watery stools that are explosive. He has no bright red blood per rectum but states that in October when he was having frequent epigastric pain and cramping he had several  days of jet black, sticky stools. His appetite as been good and he has had no weight loss. There is no family history of colon cancer, colon polyps, or inflammatory bowel disease. There is no known family history of celiac disease. His mother had gallbladder disease. His mother Brother and father all have GERD. There is no family history of esophageal or stomach cancer.   Past Medical History  Diagnosis Date  . Heartburn   . GERD (gastroesophageal reflux disease)     Past Surgical History  Procedure Laterality Date  . Arm surgery Left    Family History  Problem Relation Age of Onset  . Hypertension Father   . Heart disease Father     MI x 2, CHF  . Depression Brother   . Stroke Father   . Colon cancer Neg Hx   . Colon polyps Neg Hx   . Diabetes Neg Hx   . Kidney disease Neg Hx   . Esophageal cancer Neg Hx    History  Substance Use Topics  . Smoking status: Former Smoker    Quit date: 06/02/2005  . Smokeless tobacco: Never Used     Comment: quit 2007  . Alcohol Use: 0.0 oz/week    0 Not specified per week     Comment: occ on weekend   Current Outpatient Prescriptions  Medication Sig Dispense Refill  . cyclobenzaprine (FLEXERIL) 10 MG tablet Take 0.5-1 tablets (5-10 mg total) by mouth 3 (three) times daily  as needed for muscle spasms (sedation caution). 30 tablet 0  . Esomeprazole Magnesium (NEXIUM 24HR PO) Take 1 capsule by mouth daily.    . Multiple Vitamin (MULTI VITAMIN DAILY PO) Take 1 tablet by mouth daily.    . hyoscyamine (LEVSIN/SL) 0.125 MG SL tablet Place 1 tablet (0.125 mg total) under the tongue 2 (two) times daily as needed. 60 tablet 2  . metoCLOPramide (REGLAN) 10 MG tablet Take 1 tablet (10 mg total) by mouth at bedtime. 30 tablet 0  . MOVIPREP 100 G SOLR Take 1 kit (200 g total) by mouth once. 1 kit 0  . pantoprazole (PROTONIX) 40 MG tablet Take 1 tablet (40 mg total) by mouth every morning. Before breakfast 30 tablet 2   No current facility-administered  medications for this visit.   No Known Allergies   Review of Systems: Gen: Denies any fever, chills, sweats, anorexia, fatigue, weakness, malaise, weight loss, and sleep disorder CV: Denies chest pain, angina, palpitations, syncope, orthopnea, PND, peripheral edema, and claudication. Resp: Denies dyspnea at rest, dyspnea with exercise, cough, sputum, wheezing, coughing up blood, and pleurisy. GI: Denies vomiting blood, jaundice, and fecal incontinence.   Denies dysphagia or odynophagia. GU : Denies urinary burning, blood in urine, urinary frequency, urinary hesitancy, nocturnal urination, and urinary incontinence. MS: Denies joint pain, limitation of movement, and swelling, stiffness, low back pain, extremity pain. Denies muscle weakness, cramps, atrophy.  Derm: Denies rash, itching, dry skin, hives, moles, warts, or unhealing ulcers.  Psych: Denies depression, anxiety, memory loss, suicidal ideation, hallucinations, paranoia, and confusion. Heme: Denies bruising, bleeding, and enlarged lymph nodes. Neuro:  Denies any headaches, dizziness, paresthesias. Endo:  Denies any problems with DM, thyroid, adrenal function  Studies: US Abdomen Complete  05/01/2014   CLINICAL DATA:  Abdominal pain and vomiting.  EXAM: ULTRASOUND ABDOMEN COMPLETE  COMPARISON:  None.  FINDINGS: Gallbladder: The gallbladder exhibits multiple mobile echogenic shadowing stones. There is no gallbladder wall thickening, pericholecystic fluid, or positive sonographic Murphy's sign.  Common bile duct: Diameter: 2.5 mm  Liver: There is no focal hepatic mass nor evidence of ductal dilation. The echotexture of the liver is mildly increased which may reflect fatty infiltrated change.  IVC: Obscured by bowel gas  Pancreas: Obscured by bowel gas  Spleen: Size and appearance within normal limits.  Right Kidney: Length: 10.8 Cm. Echogenicity within normal limits. No mass or hydronephrosis visualized.  Left Kidney: Length: 12.5 cm.  Echogenicity within normal limits. No mass or hydronephrosis visualized.  Abdominal aorta: Obscured by bowel gas.  Other findings: A large amount of bowel gas in the midline limited the study.  IMPRESSION: 1. Cholelithiasis without evidence of acute cholecystitis. 2. No acute abnormality is demonstrated elsewhere within the abdomen. 3. The pancreas and abdominal aorta were obscured by bowel gas.   Electronically Signed   By: David  Martinique   On: 05/01/2014 10:21    LAB RESULTS: Comprehensive metabolic panel on 88/04/314 showed alk phosphatase 65, AST 31, ALT 28, total bili 0.5. CBC on 04/14/2014 showed a white blood count 9.3, hemoglobin 15.8, hematocrit 44.8, platelets 249,000.   Physical Exam: BP 120/76 mmHg  Pulse 64  Ht 6' 1.25" (1.861 m)  Wt 251 lb 6 oz (114.023 kg)  BMI 32.92 kg/m2 Constitutional: Pleasant,well-developed, male in no acute distress. HEENT: Normocephalic and atraumatic. Conjunctivae are normal. No scleral icterus. Neck supple.  Cardiovascular: Normal rate, regular rhythm.  Pulmonary/chest: Effort normal and breath sounds normal. No wheezing, rales or rhonchi. Abdominal: Soft, nondistended, nontender.  Bowel sounds active throughout. There are no masses palpable. No hepatomegaly. Extremities: no edema Lymphadenopathy: No cervical adenopathy noted. Neurological: Alert and oriented to person place and time. Skin: Skin is warm and dry. No rashes noted. Psychiatric: Normal mood and affect. Behavior is normal.  ASSESSMENT AND PLAN: #1. Epigastric pain and heartburn. His pain may be due to reflux, esophagitis, gastritis, ulcer among other etiologies. An antireflux regimen has been reviewed. He will discontinue his omeprazole and be given a trial of pantoprazole 40 mg by mouth every morning 30 minutes before breakfast. Because he feels as if his food is not moving out of his stomach he will be given a trial of Reglan 10 mg at at bedtime. He has been informed of the possible  side effects of Reglan and has been instructed to stop it immediately and notify us if he has any trouble tolerating the medication. He has also been instructed to have small volume frequent meals rather than several very large meals. He will be scheduled for an EGD to evaluate for esophagitis, gastritis, ulcer, etc.The risks, benefits, and alternatives to endoscopy with possible biopsy and possible dilation were discussed with the patient and they consent to proceed.   #2. Diarrhea. Patient has been troubled with loose often explosive stools for 15 years. He often has postprandial cramping relieved with defecation. He will be given a trial of Levsin 0.125 mg by mouth twice a day when necessary cramping. He will be scheduled for a colonoscopy to evaluate for polyps, neoplasia,  inflammatory bowel disease, or possible microscopic colitis.The risks, benefits, and alternatives to colonoscopy with possible biopsy and possible polypectomy were discussed with the patient and they consent to proceed. The procedures will be scheduled with Dr. Fuller Plan as the patient has requested Dr. Fuller Plan.  A celiac panel, hepatic function panel, amylase and lipase will be obtained. Further recommendations will be made pending the findings of his endoscopic evaluations.    Gregory Valencia, Vita Barley PA-C 05/03/2014, 3:53 PM

## 2014-05-04 LAB — HEPATIC FUNCTION PANEL
ALK PHOS: 61 U/L (ref 39–117)
ALT: 39 U/L (ref 0–53)
AST: 35 U/L (ref 0–37)
Albumin: 4.2 g/dL (ref 3.5–5.2)
BILIRUBIN DIRECT: 0.1 mg/dL (ref 0.0–0.3)
TOTAL PROTEIN: 7.3 g/dL (ref 6.0–8.3)
Total Bilirubin: 0.8 mg/dL (ref 0.2–1.2)

## 2014-05-04 LAB — CELIAC PANEL 10
Endomysial Screen: NEGATIVE
GLIADIN IGA: 8 U/mL (ref <20)
GLIADIN IGG: 2 U/mL (ref <20)
IGA: 243 mg/dL (ref 68–379)
TISSUE TRANSGLUT AB: 1 U/mL (ref <6)
Tissue Transglutaminase Ab, IgA: 1 U/mL (ref <4)

## 2014-05-30 ENCOUNTER — Other Ambulatory Visit (INDEPENDENT_AMBULATORY_CARE_PROVIDER_SITE_OTHER): Payer: Self-pay | Admitting: Surgery

## 2014-05-30 NOTE — H&P (Signed)
Gregory Valencia. Cur[place] 05/30/2014 8:33 AM Location: [facility] Patient #: [id number] DOB: [birth year] Married / Language: Cleophus Molt / Race: White Male History of Present Illness Adin Hector MD; 05/30/2014 9:33 AM) Patient words: gallbaldder.  The patient is a 48 year old male who presents with abdominal pain. Patient sent to me for surgical consultation by his gastroenterologist Lucio Edward. Concern for abdominal pain and nausea and vomiting with gallstones. Pleasant and active male. Comes today with his wife. 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 His been struggling with intermittent attacks of abdominal apin for the past 15 years. He describes them as "stomach attacks". Used to happen a few times a year. Then happened every 3 months. Now it is almost monthly. Increased in in frequency and intensity. Usually happens in the middle a day after having a bigger breakfasts such as eggs and sausage. His first attack happened after he had hamburger and fries at [facility]. Describes it as a epigastric pain and tightness. Becomes more diffuse and crampy. Often can radiate to the back. He will fell nauseated. Not able to burp or belch. Not consistent with heartburn or reflux. Often he has to force himself to vomit and taken ibuprofen for it to calm down. Truss to do warm soaks in the bathroom tub. Not very helpful. Usually bent over with intense cramping. Then fades away in a few hours. His first attack, they wondered if he had had gastritis. Was placed on ranitidine. He has some mild reflux which that helped but did not stop the attacks. He then switched to omeprazole. Still hadn't attack. Most recently he has been placed on Protonix. He's been on that for about 3 weeks. No attacks. He skeptical that that will prevent that. He is also concerned because he feels that his stools occasionally gets dark. No frank blood. Normally has a bowel movement  every day. Bowel movements have tended to be less well formed more loose. No fevers or chills. His respiratory physically active and working can walk several miles without difficulty. No abdominal surgeries. He thinks he has mild hemorrhoid but no bright rectal bleeding. No history of inflammatory bowel disease. No Crohn's. No ulcerative colitis.. No sick contacts. No travel history. No history of hepatitis or pancreatitis. No history of ulcer disease. Does not take nonsteroidals to often. He saw gastroenterology. Because of the dark stools, endoscopy was recommended. That has not happened yet. Supposed to be in a month. He did not know if he should come to see me until after that but kept appointment today. Other Problems Marjean Donna, [place]; 05/30/2014 8:33 AM) Cancer Gastroesophageal Reflux Disease Hemorrhoids  Past Surgical History Marjean Donna, [place]; 05/30/2014 8:33 AM) No pertinent past surgical history  Diagnostic Studies History Marjean Donna, CMA; 05/30/2014 8:33 AM) Colonoscopy never  Allergies Davy Pique Bynum, CMA; 05/30/2014 8:34 AM) No Known Drug Allergies 05/30/2014  Medication History (Sonya Bynum, CMA; 05/30/2014 8:35 AM) Cyclobenzaprine HCl (10MG  Tablet, Oral as needed) Active. Pantoprazole Sodium (40MG  Tablet DR, Oral) Active. Metoclopramide HCl (10MG  Tablet, Oral) Active.  Social History Marjean Donna, [place]; 05/30/2014 8:33 AM) Alcohol use Occasional alcohol use. Caffeine use Carbonated beverages, Coffee. No drug use Tobacco use Former smoker.  Family History Marjean Donna, [place]; 05/30/2014 8:33 AM) Cerebrovascular Accident Father. Heart Disease Father.     Review of Systems Davy Pique Bynum CMA; 05/30/2014 8:33 AM) General Not Present- Appetite Loss, Chills, Fatigue, Fever, Night Sweats, Weight Gain and Weight Loss. Skin Not Present- Change in Wart/Mole,  Dryness, Hives, Jaundice, New Lesions, Non-Healing Wounds, Rash and Ulcer. HEENT Present-  Wears glasses/contact lenses. Not Present- Earache, Hearing Loss, Hoarseness, Nose Bleed, Oral Ulcers, Ringing in the Ears, Seasonal Allergies, Sinus Pain, Sore Throat, Visual Disturbances and Yellow Eyes. Breast Not Present- Breast Mass, Breast Pain, Nipple Discharge and Skin Changes. Cardiovascular Not Present- Chest Pain, Difficulty Breathing Lying Down, Leg Cramps, Palpitations, Rapid Heart Rate, Shortness of Breath and Swelling of Extremities. Gastrointestinal Present- Abdominal Pain, Hemorrhoids and Indigestion. Not Present- Bloating, Bloody Stool, Change in Bowel Habits, Chronic diarrhea, Constipation, Difficulty Swallowing, Excessive gas, Gets full quickly at meals, Nausea, Rectal Pain and Vomiting. Male Genitourinary Not Present- Blood in Urine, Change in Urinary Stream, Frequency, Impotence, Nocturia, Painful Urination, Urgency and Urine Leakage. Musculoskeletal Present- Joint Pain. Not Present- Back Pain, Joint Stiffness, Muscle Pain, Muscle Weakness and Swelling of Extremities. Neurological Not Present- Decreased Memory, Fainting, Headaches, Numbness, Seizures, Tingling, Tremor, Trouble walking and Weakness. Psychiatric Not Present- Anxiety, Bipolar, Change in Sleep Pattern, Depression, Fearful and Frequent crying. Endocrine Not Present- Cold Intolerance, Excessive Hunger, Hair Changes, Heat Intolerance, Hot flashes and New Diabetes. Hematology Not Present- Easy Bruising, Excessive bleeding, Gland problems, HIV and Persistent Infections.  Vitals (Sonya Bynum CMA; 05/30/2014 8:34 AM) 05/30/2014 8:33 AM Weight: 256 lb Height: 73in Body Surface Area: 2.45 m Body Mass Index: 33.77 kg/m Temp.: 54F(Temporal)  Pulse: 76 (Regular)  BP: 128/70 (Sitting, Left Arm, Standard)     Physical Exam Adin Hector MD; 05/30/2014 9:33 AM)  General Mental Status-Alert. General Appearance-Not in acute distress, Not Sickly. Orientation-Oriented X3. Hydration-Well  hydrated. Voice-Normal.  Integumentary Global Assessment Upon inspection and palpation of skin surfaces of the - Axillae: non-tender, no inflammation or ulceration, no drainage. and Distribution of scalp and body hair is normal. General Characteristics Temperature - normal warmth is noted.  Head and Neck Head-normocephalic, atraumatic with no lesions or palpable masses. Face Global Assessment - atraumatic, no absence of expression. Neck Global Assessment - no abnormal movements, no bruit auscultated on the right, no bruit auscultated on the left, no decreased range of motion, non-tender. Trachea-midline. Thyroid Gland Characteristics - non-tender.  Eye Eyeball - Left-Extraocular movements intact, No Nystagmus. Eyeball - Right-Extraocular movements intact, No Nystagmus. Cornea - Left-No Hazy. Cornea - Right-No Hazy. Sclera/Conjunctiva - Left-No scleral icterus, No Discharge. Sclera/Conjunctiva - Right-No scleral icterus, No Discharge. Pupil - Left-Direct reaction to light normal. Pupil - Right-Direct reaction to light normal.  ENMT Ears Pinna - Left - no drainage observed, no generalized tenderness observed. Right - no drainage observed, no generalized tenderness observed. Nose and Sinuses External Inspection of the Nose - no destructive lesion observed. Inspection of the nares - Left - quiet respiration. Right - quiet respiration. Mouth and Throat Lips - Upper Lip - no fissures observed, no pallor noted. Lower Lip - no fissures observed, no pallor noted. Nasopharynx - no discharge present. Oral Cavity/Oropharynx - Tongue - no dryness observed. Oral Mucosa - no cyanosis observed. Hypopharynx - no evidence of airway distress observed.  Chest and Lung Exam Inspection Movements - Normal and Symmetrical. Accessory muscles - No use of accessory muscles in breathing. Palpation Palpation of the chest reveals - Non-tender. Auscultation Breath sounds - Normal  and Clear.  Cardiovascular Auscultation Rhythm - Regular. Murmurs & Other Heart Sounds - Auscultation of the heart reveals - No Murmurs and No Systolic Clicks.  Abdomen Inspection Inspection of the abdomen reveals - No Visible peristalsis and No Abnormal pulsations. Umbilicus - No Bleeding, No Urine drainage. Palpation/Percussion  Palpation and Percussion of the abdomen reveal - Soft, Non Tender, No Rebound tenderness, No Rigidity (guarding) and No Cutaneous hyperesthesia. Note: Mild epigastric and right upper quadrant discomfort. No Murphy sign. Mild lower abdominal pain.   Male Genitourinary Sexual Maturity Tanner 5 - Adult hair pattern and Adult penile size and shape.  Peripheral Vascular Upper Extremity Inspection - Left - No Cyanotic nailbeds, Not Ischemic. Right - No Cyanotic nailbeds, Not Ischemic.  Neurologic Neurologic evaluation reveals -normal attention span and ability to concentrate, able to name objects and repeat phrases. Appropriate fund of knowledge , normal sensation and normal coordination. Mental Status Affect - not angry, not paranoid. Cranial Nerves-Normal Bilaterally. Gait-Normal.  Neuropsychiatric Mental status exam performed with findings of-able to articulate well with normal speech/language, rate, volume and coherence, thought content normal with ability to perform basic computations and apply abstract reasoning and no evidence of hallucinations, delusions, obsessions or homicidal/suicidal ideation.  Musculoskeletal Global Assessment Spine, Ribs and Pelvis - no instability, subluxation or laxity. Right Upper Extremity - no instability, subluxation or laxity.  Lymphatic Head & Neck  General Head & Neck Lymphatics: Bilateral - Description - No Localized lymphadenopathy. Axillary  General Axillary Region: Bilateral - Description - No Localized lymphadenopathy. Femoral & Inguinal  Generalized Femoral & Inguinal Lymphatics: Left - Description  - No Localized lymphadenopathy. Right - Description - No Localized lymphadenopathy.    Assessment & Plan Adin Hector MD; 05/30/2014 9:35 AM)  CALCULUS OF GALLBLADDER WITH CHRONIC CHOLECYSTITIS WITHOUT OBSTRUCTION (574.10  K80.10) Impression: He has a pretty classic story for symptomatic gallstones. Ultrasound confirms this. I think he would benefit from cholecystectomy. Reasonable for single site approach:  The anatomy & physiology of hepatobiliary & pancreatic function was discussed. The pathophysiology of gallbladder dysfunction was discussed. Natural history risks without surgery was discussed. I feel the risks of no intervention will lead to serious problems that outweigh the operative risks; therefore, I recommended cholecystectomy to remove the pathology. I explained laparoscopic techniques with possible need for an open approach. Probable cholangiogram to evaluate the bilary tract was explained as well.  Risks such as bleeding, infection, abscess, leak, injury to other organs, need for further treatment, heart attack, death, and other risks were discussed. I noted a good likelihood this will help address the problem. Possibility that this will not correct all abdominal symptoms was explained. Goals of post-operative recovery were discussed as well. We will work to minimize complications. An educational handout further explaining the pathology and treatment options was given as well. Questions were answered. The patient expresses understanding & wishes to proceed with surgery.  Current Plans Schedule for Surgery Pt Education - [facility] Laparosopic Post Op HCI (Justus Duerr) Pt Education - [facility] Good Bowel Health (Tykel Badie) Pt Education - [facility] Pain Control (Delance Weide)  DARK STOOLS (792.1  R19.5) Impression: Not sure of the etiology of this. He is not anemic. He is due for screening colonoscopy anyway. With some history of heartburn and occasional nonsteroidals, reasonable to evaluate forgut as well. If  that is completely normal, makes my suspicion of chronic cholecystitis as the etiology of his pain more likely. Due for endoscopy next month.  Adin Hector, M.D., F.A.C.S. Gastrointestinal and Minimally Invasive Surgery [facility] [address] [place], [place] [address] [phone] Main / Paging

## 2014-05-30 NOTE — Progress Notes (Signed)
When is this pt scheduled for colon and egd?

## 2014-05-30 NOTE — Progress Notes (Signed)
It is scheduled for 06/26/14.

## 2014-06-26 ENCOUNTER — Ambulatory Visit (AMBULATORY_SURGERY_CENTER): Payer: PRIVATE HEALTH INSURANCE | Admitting: Gastroenterology

## 2014-06-26 ENCOUNTER — Encounter: Payer: Self-pay | Admitting: Gastroenterology

## 2014-06-26 VITALS — BP 127/75 | HR 60 | Temp 96.8°F | Resp 9 | Ht 73.0 in | Wt 251.0 lb

## 2014-06-26 DIAGNOSIS — R197 Diarrhea, unspecified: Secondary | ICD-10-CM

## 2014-06-26 DIAGNOSIS — R1013 Epigastric pain: Secondary | ICD-10-CM

## 2014-06-26 DIAGNOSIS — K219 Gastro-esophageal reflux disease without esophagitis: Secondary | ICD-10-CM

## 2014-06-26 MED ORDER — SODIUM CHLORIDE 0.9 % IV SOLN: 500.0000 mL | INTRAVENOUS | Status: DC

## 2014-06-26 NOTE — Op Note (Signed)
[facility]  [address] [place], [address]   COLONOSCOPY PROCEDURE REPORT  PATIENT: Gregory Valencia, Gregory Valencia  MR#: [id number] BIRTHDATE: 1965/11/09 , 48  yrs. old GENDER: male ENDOSCOPIST: Ladene Artist, MD, [facility] REFERRED ZW:CHENID Damita Dunnings, M.D. PROCEDURE DATE:  06/26/2014 PROCEDURE:   Colonoscopy with biopsy First Screening Colonoscopy - Avg.  risk and is 50 yrs.  old or older - No.  Prior Negative Screening - Now for repeat screening. N/A  History of Adenoma - Now for follow-up colonoscopy & has been > or = to 3 yrs.  N/A  Polyps Removed Today? No.  Polyps Removed Today? No.  Recommend repeat exam, <10 yrs? Polyps Removed Today? No.  Recommend repeat exam, <10 yrs? No. ASA CLASS:   Class II INDICATIONS:unexplained diarrhea. MEDICATIONS: Monitored anesthesia care and Propofol 250 mg IV DESCRIPTION OF PROCEDURE:   After the risks benefits and alternatives of the procedure were thoroughly explained, informed consent was obtained.  The digital rectal exam revealed no abnormalities of the rectum.   The [id number]  endoscope was introduced through the anus and advanced to the terminal ileum which was intubated for a short distance. No adverse events experienced.   The quality of the prep was excellent, using MoviPrep  The instrument was then slowly withdrawn as the colon was fully examined.  COLON FINDINGS: A normal appearing cecum, ileocecal valve, and appendiceal orifice were identified.  The ascending, transverse, descending, sigmoid colon, and rectum appeared unremarkable. Multiple random biopsies were performed.   The examined terminal ileum appeared to be normal.  Retroflexed views revealed internal Grade I hemorrhoids. The time to cecum=1 minutes 31 seconds. Withdrawal time=8 minutes 34 seconds.  The scope was withdrawn and the procedure completed. COMPLICATIONS: There were no immediate complications.  ENDOSCOPIC IMPRESSION: 1.   Normal colonoscopy;  multiple random biopsies performed 2.   The examined terminal ileum appeared to be normal 3.   Grade I internal hemorrhoids  RECOMMENDATIONS: 1.  Await pathology results 2.  You should continue to follow colorectal cancer screening guidelines for "routine risk" patients with a repeat colonoscopy in 10 years.  There is no need for routine, screening FOBT (stool) testing for at least 5 years.  eSigned:  Ladene Artist, MD, [facility] 06/26/2014 1:45 PM

## 2014-06-26 NOTE — Progress Notes (Signed)
Report to PACU, RN, vss, BBS= Clear.  

## 2014-06-26 NOTE — Progress Notes (Signed)
Called to room to assist during endoscopic procedure.  Patient ID and intended procedure confirmed with present staff. Received instructions for my participation in the procedure from the performing physician.  

## 2014-06-26 NOTE — Op Note (Signed)
[facility]  [employer] [place], [address]   ENDOSCOPY PROCEDURE REPORT  PATIENT: Gregory, Valencia  MR#: [id number] BIRTHDATE: 1965-07-10 , 48  yrs. old GENDER: male ENDOSCOPIST: Ladene Artist, MD, [facility] REFERRED BY:  Elsie Stain, M.D. PROCEDURE DATE:  06/26/2014 PROCEDURE:  EGD, diagnostic ASA CLASS:     Class II INDICATIONS:  epigastric pain and history of esophageal reflux. MEDICATIONS: Monitored anesthesia care, Residual sedation present, and Propofol 150 mg IV TOPICAL ANESTHETIC: none DESCRIPTION OF PROCEDURE: After the risks benefits and alternatives of the procedure were thoroughly explained, informed consent was obtained.  The LB [id number] endoscope was introduced through the mouth and advanced to the second portion of the duodenum , Without limitations.  The instrument was slowly withdrawn as the mucosa was fully examined.    ESOPHAGUS: The mucosa of the esophagus appeared normal. STOMACH: The mucosa and folds of the stomach appeared normal. DUODENUM: The duodenal mucosa showed no abnormalities in the bulb and 2nd part of the duodenum.  Retroflexed views revealed no abnormalities.   The scope was then withdrawn from the patient and the procedure completed.  COMPLICATIONS: There were no immediate complications.  ENDOSCOPIC IMPRESSION: 1.   The EGD appeared normal  RECOMMENDATIONS: 1.  Anti-reflux regimen 2.  Continue PPI daily  eSigned:  Ladene Artist, MD, [facility] 06/26/2014 1:47 PM

## 2014-06-26 NOTE — Patient Instructions (Signed)
Discharge instructions given. Handout on hemorrhoids and a high fiber diet. Resume previous medications. YOU HAD AN ENDOSCOPIC PROCEDURE TODAY AT [facility]: Refer to the procedure report that was given to you for any specific questions about what was found during the examination.  If the procedure report does not answer your questions, please call your gastroenterologist to clarify.  If you requested that your care partner not be given the details of your procedure findings, then the procedure report has been included in a sealed envelope for you to review at your convenience later.  YOU SHOULD EXPECT: Some feelings of bloating in the abdomen. Passage of more gas than usual.  Walking can help get rid of the air that was put into your GI tract during the procedure and reduce the bloating. If you had a lower endoscopy (such as a colonoscopy or flexible sigmoidoscopy) you may notice spotting of blood in your stool or on the toilet paper. If you underwent a bowel prep for your procedure, then you may not have a normal bowel movement for a few days.  DIET: Your first meal following the procedure should be a light meal and then it is ok to progress to your normal diet.  A half-sandwich or bowl of soup is an example of a good first meal.  Heavy or fried foods are harder to digest and may make you feel nauseous or bloated.  Likewise meals heavy in dairy and vegetables can cause extra gas to form and this can also increase the bloating.  Drink plenty of fluids but you should avoid alcoholic beverages for 24 hours.  ACTIVITY: Your care partner should take you home directly after the procedure.  You should plan to take it easy, moving slowly for the rest of the day.  You can resume normal activity the day after the procedure however you should NOT DRIVE or use heavy machinery for 24 hours (because of the sedation medicines used during the test).    SYMPTOMS TO REPORT IMMEDIATELY: A  gastroenterologist can be reached at any hour.  During normal business hours, 8:30 AM to 5:00 PM Monday through Friday, call [phone].  After hours and on weekends, please call the GI answering service at [phone] who will take a message and have the physician on call contact you.   Following lower endoscopy (colonoscopy or flexible sigmoidoscopy):  Excessive amounts of blood in the stool  Significant tenderness or worsening of abdominal pains  Swelling of the abdomen that is new, acute  Fever of 100F or higher  Following upper endoscopy (EGD)  Vomiting of blood or coffee ground material  New chest pain or pain under the shoulder blades  Painful or persistently difficult swallowing  New shortness of breath  Fever of 100F or higher  Black, tarry-looking stools  FOLLOW UP: If any biopsies were taken you will be contacted by phone or by letter within the next 1-3 weeks.  Call your gastroenterologist if you have not heard about the biopsies in 3 weeks.  Our staff will call the home number listed on your records the next business day following your procedure to check on you and address any questions or concerns that you may have at that time regarding the information given to you following your procedure. This is a courtesy call and so if there is no answer at the home number and we have not heard from you through the emergency physician on call, we will assume that you have  returned to your regular daily activities without incident.  SIGNATURES/CONFIDENTIALITY: You and/or your care partner have signed paperwork which will be entered into your electronic medical record.  These signatures attest to the fact that that the information above on your After Visit Summary has been reviewed and is understood.  Full responsibility of the confidentiality of this discharge information lies with you and/or your care-partner.

## 2014-06-27 ENCOUNTER — Telehealth: Payer: Self-pay | Admitting: *Deleted

## 2014-06-27 NOTE — Telephone Encounter (Signed)
  Follow up Call-  Call back number 06/26/2014  Post procedure Call Back phone  # [phone]  Permission to leave phone message Yes     Patient questions:  Do you have a fever, pain , or abdominal swelling? No. Pain Score  0 *  Have you tolerated food without any problems? Yes.    Have you been able to return to your normal activities? Yes.    Do you have any questions about your discharge instructions: Diet   No. Medications  No. Follow up visit  No.  Do you have questions or concerns about your Care? No.  Actions: * If pain score is 4 or above: No action needed, pain <4.

## 2014-07-03 ENCOUNTER — Encounter: Payer: Self-pay | Admitting: Gastroenterology

## 2014-07-17 ENCOUNTER — Other Ambulatory Visit (HOSPITAL_COMMUNITY): Payer: Self-pay | Admitting: Surgery

## 2014-07-18 ENCOUNTER — Encounter (HOSPITAL_COMMUNITY): Payer: Self-pay

## 2014-07-18 ENCOUNTER — Encounter (HOSPITAL_COMMUNITY)
Admission: RE | Admit: 2014-07-18 | Discharge: 2014-07-18 | Disposition: A | Discharge status: Home / Self Care | Payer: 59 | Source: Ambulatory Visit | Attending: Surgery | Admitting: Surgery

## 2014-07-18 DIAGNOSIS — Z87891 Personal history of nicotine dependence: Secondary | ICD-10-CM | POA: Diagnosis not present

## 2014-07-18 DIAGNOSIS — K219 Gastro-esophageal reflux disease without esophagitis: Secondary | ICD-10-CM | POA: Diagnosis not present

## 2014-07-18 DIAGNOSIS — K660 Peritoneal adhesions (postprocedural) (postinfection): Secondary | ICD-10-CM | POA: Diagnosis not present

## 2014-07-18 DIAGNOSIS — K8050 Calculus of bile duct without cholangitis or cholecystitis without obstruction: Secondary | ICD-10-CM | POA: Diagnosis not present

## 2014-07-18 LAB — CBC
HEMATOCRIT: 48.5 % (ref 39.0–52.0)
Hemoglobin: 15.9 g/dL (ref 13.0–17.0)
MCH: 28.6 pg (ref 26.0–34.0)
MCHC: 32.8 g/dL (ref 30.0–36.0)
MCV: 87.4 fL (ref 78.0–100.0)
Platelets: 256 10*3/uL (ref 150–400)
RBC: 5.55 MIL/uL (ref 4.22–5.81)
RDW: 13 % (ref 11.5–15.5)
WBC: 9.3 10*3/uL (ref 4.0–10.5)
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
## 2014-07-18 NOTE — Patient Instructions (Signed)
Your procedure is scheduled on:  07/20/14  THURSDAY  Report to [facility] at   0730    AM.   Call this number if you have problems the morning of surgery: [phone]        Do not eat food  Or drink :After Midnight. Wednesday NIGHT   Take these medicines the morning of surgery with A SIP OF WATER:Pantoprazole   .  Contacts, dentures or partial plates, or metal hairpins  can not be worn to surgery. Your family will be responsible for glasses, dentures, hearing aides while you are in surgery  Leave suitcase in the car. After surgery it may be brought to your room.  For patients admitted to the hospital, checkout time is 11:00 AM day of  discharge.         [facility] IS NOT RESPONSIBLE FOR ANY VALUABLES  Patients discharged the day of surgery will not be allowed to drive home. IF going home the day of surgery, you must have a driver and someone to stay with you for the first 24 hours  Name and phone number of your driver:  Wife  [facility] - Preparing for Surgery Before surgery, you can play an important role.  Because skin is not sterile, your skin needs to be as free of germs as possible.  You can reduce the number of germs on your skin by washing with CHG (chlorahexidine gluconate) soap before surgery.  CHG is an antiseptic cleaner which kills germs and bonds with the skin to continue killing germs even after washing. Please DO NOT use if you have an allergy to CHG or antibacterial soaps.  If your skin becomes reddened/irritated stop using the CHG and inform your nurse when you arrive at Short Stay. Do not shave (including legs and underarms) for at least 48 hours prior to the first CHG shower.  You may shave your face/neck. Please follow these instructions  carefully:  1.  Shower with CHG Soap the night before surgery and the  morning of Surgery.  2.  If you choose to wash your hair, wash your hair first as usual with your  normal  shampoo.  3.  After you shampoo, rinse your hair and body thoroughly to remove the  shampoo.                           4.  Use CHG as you would any other liquid soap.  You can apply chg directly  to the skin and wash  Gently with a scrungie or clean washcloth.  5.  Apply the CHG Soap to your body ONLY FROM THE NECK DOWN.   Do not use on face/ open                           Wound or open sores. Avoid contact with eyes, ears mouth and genitals (private parts).                       Wash face,  Genitals (private parts) with your normal soap.             6.  Wash thoroughly, paying special attention to the area where your surgery  will be performed.  7.  Thoroughly rinse your body with warm water from the neck down.  8.  DO NOT shower/wash with your normal soap after using and rinsing off  the CHG Soap.                9.  Pat yourself dry with a clean towel.            10.  Wear clean pajamas.            11.  Place clean sheets on your bed the night of your first shower and do not  sleep with pets. Day of Surgery : Do not apply any lotions/deodorants the morning of surgery.  Please wear clean clothes to the hospital/surgery center.  FAILURE TO FOLLOW THESE INSTRUCTIONS MAY RESULT IN THE CANCELLATION OF YOUR SURGERY PATIENT SIGNATURE_________________________________  NURSE SIGNATURE__________________________________  ________________________________________________________________________

## 2014-07-20 ENCOUNTER — Ambulatory Visit (HOSPITAL_COMMUNITY): Payer: 59 | Admitting: Anesthesiology

## 2014-07-20 ENCOUNTER — Encounter (HOSPITAL_COMMUNITY)
Admission: RE | Disposition: A | Discharge status: Home / Self Care | Payer: Self-pay | Source: Ambulatory Visit | Attending: Surgery

## 2014-07-20 ENCOUNTER — Encounter (HOSPITAL_COMMUNITY): Payer: Self-pay | Admitting: *Deleted

## 2014-07-20 ENCOUNTER — Ambulatory Visit (HOSPITAL_COMMUNITY)
Admission: RE | Admit: 2014-07-20 | Discharge: 2014-07-20 | Disposition: A | Discharge status: Home / Self Care | Payer: 59 | Source: Ambulatory Visit | Attending: Surgery | Admitting: Surgery

## 2014-07-20 ENCOUNTER — Ambulatory Visit (HOSPITAL_COMMUNITY): Payer: 59

## 2014-07-20 DIAGNOSIS — K660 Peritoneal adhesions (postprocedural) (postinfection): Secondary | ICD-10-CM | POA: Insufficient documentation

## 2014-07-20 DIAGNOSIS — Z419 Encounter for procedure for purposes other than remedying health state, unspecified: Secondary | ICD-10-CM

## 2014-07-20 DIAGNOSIS — K8050 Calculus of bile duct without cholangitis or cholecystitis without obstruction: Secondary | ICD-10-CM | POA: Insufficient documentation

## 2014-07-20 DIAGNOSIS — Z87891 Personal history of nicotine dependence: Secondary | ICD-10-CM | POA: Insufficient documentation

## 2014-07-20 DIAGNOSIS — K219 Gastro-esophageal reflux disease without esophagitis: Secondary | ICD-10-CM | POA: Insufficient documentation

## 2014-07-20 HISTORY — PX: LAPAROSCOPIC CHOLECYSTECTOMY SINGLE SITE WITH INTRAOPERATIVE CHOLANGIOGRAM: SHX6538

## 2014-07-20 SURGERY — LAPAROSCOPIC CHOLECYSTECTOMY SINGLE SITE WITH INTRAOPERATIVE CHOLANGIOGRAM
Anesthesia: General | Site: Abdomen

## 2014-07-20 MED ORDER — ONDANSETRON HCL 4 MG/2ML IJ SOLN
INTRAMUSCULAR | Status: AC
  Filled 2014-07-20: qty 2

## 2014-07-20 MED ORDER — ATROPINE SULFATE 0.4 MG/ML IJ SOLN
INTRAMUSCULAR | Status: AC
  Filled 2014-07-20: qty 1

## 2014-07-20 MED ORDER — MIDAZOLAM HCL 2 MG/2ML IJ SOLN
INTRAMUSCULAR | Status: AC
  Filled 2014-07-20: qty 2

## 2014-07-20 MED ORDER — KETOROLAC TROMETHAMINE 30 MG/ML IJ SOLN
INTRAMUSCULAR | Status: DC | PRN
  Administered 2014-07-20: 30 mg via INTRAVENOUS

## 2014-07-20 MED ORDER — LIDOCAINE HCL (CARDIAC) 20 MG/ML IV SOLN
INTRAVENOUS | Status: AC
  Filled 2014-07-20: qty 5

## 2014-07-20 MED ORDER — NEOSTIGMINE METHYLSULFATE 10 MG/10ML IV SOLN
INTRAVENOUS | Status: AC
  Filled 2014-07-20: qty 1

## 2014-07-20 MED ORDER — SUCCINYLCHOLINE CHLORIDE 20 MG/ML IJ SOLN
INTRAMUSCULAR | Status: DC | PRN
  Administered 2014-07-20: 120 mg via INTRAVENOUS

## 2014-07-20 MED ORDER — LIDOCAINE HCL (CARDIAC) 20 MG/ML IV SOLN
INTRAVENOUS | Status: DC | PRN
  Administered 2014-07-20: 75 mg via INTRAVENOUS

## 2014-07-20 MED ORDER — CYCLOBENZAPRINE HCL 10 MG PO TABS: 5.0000 mg | ORAL_TABLET | Freq: Three times a day (TID) | ORAL | Status: DC | PRN

## 2014-07-20 MED ORDER — CISATRACURIUM BESYLATE 20 MG/10ML IV SOLN
INTRAVENOUS | Status: AC
  Filled 2014-07-20: qty 10

## 2014-07-20 MED ORDER — ACETAMINOPHEN 10 MG/ML IV SOLN
1000.0000 mg | Freq: Once | INTRAVENOUS | Status: AC
  Administered 2014-07-20: 1000 mg via INTRAVENOUS
  Filled 2014-07-20: qty 100

## 2014-07-20 MED ORDER — OXYCODONE HCL 5 MG PO TABS
5.0000 mg | ORAL_TABLET | ORAL | Status: DC | PRN
  Administered 2014-07-20: 5 mg via ORAL
  Filled 2014-07-20: qty 1

## 2014-07-20 MED ORDER — BUPIVACAINE-EPINEPHRINE (PF) 0.25% -1:200000 IJ SOLN
INTRAMUSCULAR | Status: AC
  Filled 2014-07-20: qty 60

## 2014-07-20 MED ORDER — DEXAMETHASONE SODIUM PHOSPHATE 10 MG/ML IJ SOLN
INTRAMUSCULAR | Status: AC
  Filled 2014-07-20: qty 1

## 2014-07-20 MED ORDER — PROPOFOL 10 MG/ML IV BOLUS
INTRAVENOUS | Status: AC
  Filled 2014-07-20: qty 20

## 2014-07-20 MED ORDER — NEOSTIGMINE METHYLSULFATE 10 MG/10ML IV SOLN
INTRAVENOUS | Status: DC | PRN
  Administered 2014-07-20: 5 mg via INTRAVENOUS

## 2014-07-20 MED ORDER — GLYCOPYRROLATE 0.2 MG/ML IJ SOLN
INTRAMUSCULAR | Status: AC
  Filled 2014-07-20: qty 4

## 2014-07-20 MED ORDER — LACTATED RINGERS IV SOLN: INTRAVENOUS | Status: DC

## 2014-07-20 MED ORDER — DEXAMETHASONE SODIUM PHOSPHATE 10 MG/ML IJ SOLN
INTRAMUSCULAR | Status: DC | PRN
  Administered 2014-07-20: 10 mg via INTRAVENOUS

## 2014-07-20 MED ORDER — CISATRACURIUM BESYLATE (PF) 10 MG/5ML IV SOLN
INTRAVENOUS | Status: DC | PRN
  Administered 2014-07-20: 6 mg via INTRAVENOUS
  Administered 2014-07-20: 4 mg via INTRAVENOUS

## 2014-07-20 MED ORDER — LACTATED RINGERS IV SOLN
INTRAVENOUS | Status: DC
  Administered 2014-07-20: 1000 mL via INTRAVENOUS
  Administered 2014-07-20: 10:00:00 via INTRAVENOUS

## 2014-07-20 MED ORDER — HYDROMORPHONE HCL 1 MG/ML IJ SOLN: 0.2500 mg | INTRAMUSCULAR | Status: DC | PRN

## 2014-07-20 MED ORDER — EPHEDRINE SULFATE 50 MG/ML IJ SOLN
INTRAMUSCULAR | Status: AC
  Filled 2014-07-20: qty 1

## 2014-07-20 MED ORDER — ONDANSETRON HCL 4 MG/2ML IJ SOLN
INTRAMUSCULAR | Status: DC | PRN
  Administered 2014-07-20: 4 mg via INTRAVENOUS

## 2014-07-20 MED ORDER — IOHEXOL 300 MG/ML  SOLN
INTRAMUSCULAR | Status: DC | PRN
  Administered 2014-07-20: 2.5 mL via INTRAVENOUS

## 2014-07-20 MED ORDER — FENTANYL CITRATE 0.05 MG/ML IJ SOLN
INTRAMUSCULAR | Status: AC
  Filled 2014-07-20: qty 5

## 2014-07-20 MED ORDER — PROPOFOL 10 MG/ML IV BOLUS
INTRAVENOUS | Status: DC | PRN
  Administered 2014-07-20: 200 mg via INTRAVENOUS

## 2014-07-20 MED ORDER — BUPIVACAINE-EPINEPHRINE 0.25% -1:200000 IJ SOLN
INTRAMUSCULAR | Status: DC | PRN
  Administered 2014-07-20: 60 mL

## 2014-07-20 MED ORDER — FENTANYL CITRATE 0.05 MG/ML IJ SOLN
INTRAMUSCULAR | Status: DC | PRN
  Administered 2014-07-20 (×2): 50 ug via INTRAVENOUS
  Administered 2014-07-20 (×2): 100 ug via INTRAVENOUS
  Administered 2014-07-20: 50 ug via INTRAVENOUS
  Administered 2014-07-20: 100 ug via INTRAVENOUS
  Administered 2014-07-20: 50 ug via INTRAVENOUS

## 2014-07-20 MED ORDER — OXYCODONE HCL 5 MG PO TABS: 5.0000 mg | ORAL_TABLET | ORAL | Status: DC | PRN

## 2014-07-20 MED ORDER — MIDAZOLAM HCL 5 MG/5ML IJ SOLN
INTRAMUSCULAR | Status: DC | PRN
  Administered 2014-07-20: 2 mg via INTRAVENOUS

## 2014-07-20 MED ORDER — GLYCOPYRROLATE 0.2 MG/ML IJ SOLN
INTRAMUSCULAR | Status: DC | PRN
  Administered 2014-07-20: .1 mg via INTRAVENOUS
  Administered 2014-07-20: 0.2 mg via INTRAVENOUS
  Administered 2014-07-20: 0.6 mg via INTRAVENOUS

## 2014-07-20 SURGICAL SUPPLY — 39 items
APPLIER CLIP 5 13 M/L LIGAMAX5 (MISCELLANEOUS) ×2
APR CLP MED LRG 5 ANG JAW (MISCELLANEOUS) ×1
BAG SPEC RTRVL LRG 6X4 10 (ENDOMECHANICALS)
CABLE HIGH FREQUENCY MONO STRZ (ELECTRODE) ×2 IMPLANT
CHLORAPREP W/TINT 26ML (MISCELLANEOUS) ×2 IMPLANT
CLIP APPLIE 5 13 M/L LIGAMAX5 (MISCELLANEOUS) ×1 IMPLANT
COVER MAYO STAND STRL (DRAPES) ×2 IMPLANT
DECANTER SPIKE VIAL GLASS SM (MISCELLANEOUS) IMPLANT
DRAIN CHANNEL 19F RND (DRAIN) IMPLANT
DRAPE C-ARM 42X120 X-RAY (DRAPES) ×2 IMPLANT
DRAPE LAPAROSCOPIC ABDOMINAL (DRAPES) ×2 IMPLANT
DRAPE UTILITY XL STRL (DRAPES) IMPLANT
DRAPE WARM FLUID 44X44 (DRAPE) ×2 IMPLANT
DRSG TEGADERM 4X4.75 (GAUZE/BANDAGES/DRESSINGS) ×2 IMPLANT
ELECT REM PT RETURN 9FT ADLT (ELECTROSURGICAL) ×2
ELECTRODE REM PT RTRN 9FT ADLT (ELECTROSURGICAL) ×1 IMPLANT
ENDOLOOP SUT PDS II  0 18 (SUTURE)
ENDOLOOP SUT PDS II 0 18 (SUTURE) IMPLANT
EVACUATOR SILICONE 100CC (DRAIN) IMPLANT
GAUZE SPONGE 2X2 8PLY STRL LF (GAUZE/BANDAGES/DRESSINGS) ×1 IMPLANT
GLOVE ECLIPSE 8.0 STRL XLNG CF (GLOVE) ×2 IMPLANT
GLOVE INDICATOR 8.0 STRL GRN (GLOVE) ×2 IMPLANT
GOWN STRL REUS W/TWL XL LVL3 (GOWN DISPOSABLE) ×8 IMPLANT
KIT BASIN OR (CUSTOM PROCEDURE TRAY) ×2 IMPLANT
NS IRRIG 1000ML POUR BTL (IV SOLUTION) ×2 IMPLANT
POUCH SPECIMEN RETRIEVAL 10MM (ENDOMECHANICALS) IMPLANT
SCISSORS LAP 5X35 DISP (ENDOMECHANICALS) ×2 IMPLANT
SET CHOLANGIOGRAPH MIX (MISCELLANEOUS) ×2 IMPLANT
SET IRRIG TUBING LAPAROSCOPIC (IRRIGATION / IRRIGATOR) ×2 IMPLANT
SHEARS HARMONIC ACE PLUS 36CM (ENDOMECHANICALS) ×2 IMPLANT
SPONGE GAUZE 2X2 STER 10/PKG (GAUZE/BANDAGES/DRESSINGS) ×1
SUT MNCRL AB 4-0 PS2 18 (SUTURE) ×2 IMPLANT
SUT PDS AB 1 CT1 27 (SUTURE) ×4 IMPLANT
SYR 20CC LL (SYRINGE) ×2 IMPLANT
TOWEL OR 17X26 10 PK STRL BLUE (TOWEL DISPOSABLE) ×2 IMPLANT
TOWEL OR NON WOVEN STRL DISP B (DISPOSABLE) ×2 IMPLANT
TRAY LAPAROSCOPIC (CUSTOM PROCEDURE TRAY) ×2 IMPLANT
TROCAR BLADELESS OPT 5 100 (ENDOMECHANICALS) ×2 IMPLANT
TROCAR BLADELESS OPT 5 150 (ENDOMECHANICALS) ×2 IMPLANT

## 2014-07-20 NOTE — Anesthesia Postprocedure Evaluation (Signed)
  Anesthesia Post-op Note  Patient: Gregory Valencia  Procedure(s) Performed: Procedure(s) (LRB): LAPAROSCOPIC CHOLECYSTECTOMY SINGLE SITE WITH INTRAOPERATIVE CHOLANGIOGRAM (N/A)  Patient Location: PACU  Anesthesia Type: General  Level of Consciousness: awake and alert   Airway and Oxygen Therapy: Patient Spontanous Breathing  Post-op Pain: mild  Post-op Assessment: Post-op Vital signs reviewed, Patient's Cardiovascular Status Stable, Respiratory Function Stable, Patent Airway and No signs of Nausea or vomiting  Last Vitals:  Filed Vitals:   07/20/14 1200  BP: 137/93  Pulse: 55  Temp: 36.6 C  Resp: 14    Post-op Vital Signs: stable   Complications: No apparent anesthesia complications

## 2014-07-20 NOTE — Discharge Instructions (Signed)
LAPAROSCOPIC SURGERY: POST OP INSTRUCTIONS ° °1. DIET: Follow a light bland diet the first 24 hours after arrival home, such as soup, liquids, crackers, etc.  Be sure to include lots of fluids daily.  Avoid fast food or heavy meals as your are more likely to get nauseated.  Eat a low fat the next few days after surgery.   °2. Take your usually prescribed home medications unless otherwise directed. °3. PAIN CONTROL: °a. Pain is best controlled by a usual combination of three different methods TOGETHER: °i. Ice/Heat °ii. Over the counter pain medication °iii. Prescription pain medication °b. Most patients will experience some swelling and bruising around the incisions.  Ice packs or heating pads (30-60 minutes up to 6 times a day) will help. Use ice for the first few days to help decrease swelling and bruising, then switch to heat to help relax tight/sore spots and speed recovery.  Some people prefer to use ice alone, heat alone, alternating between ice & heat.  Experiment to what works for you.  Swelling and bruising can take several weeks to resolve.   °c. It is helpful to take an over-the-counter pain medication regularly for the first few weeks.  Choose one of the following that works best for you: °i. Naproxen (Aleve, etc)  Two 220mg tabs twice a day °ii. Ibuprofen (Advil, etc) Three 200mg tabs four times a day (every meal & bedtime) °iii. Acetaminophen (Tylenol, etc) 500-650mg four times a day (every meal & bedtime) °d. A  prescription for pain medication (such as oxycodone, hydrocodone, etc) should be given to you upon discharge.  Take your pain medication as prescribed.  °i. If you are having problems/concerns with the prescription medicine (does not control pain, nausea, vomiting, rash, itching, etc), please call us [phone] to see if we need to switch you to a different pain medicine that will work better for you and/or control your side effect better. °ii. If you need a refill on your pain medication,  please contact your pharmacy.  They will contact our office to request authorization. Prescriptions will not be filled after 5 pm or on week-ends. °4. Avoid getting constipated.  Between the surgery and the pain medications, it is common to experience some constipation.  Increasing fluid intake and taking a fiber supplement (such as Metamucil, Citrucel, FiberCon, MiraLax, etc) 1-2 times a day regularly will usually help prevent this problem from occurring.  A mild laxative (prune juice, Milk of Magnesia, MiraLax, etc) should be taken according to package directions if there are no bowel movements after 48 hours.   °5. Watch out for diarrhea.  If you have many loose bowel movements, simplify your diet to bland foods & liquids for a few days.  Stop any stool softeners and decrease your fiber supplement.  Switching to mild anti-diarrheal medications (Kayopectate, Pepto Bismol) can help.  If this worsens or does not improve, please call us. °6. Wash / shower every day.  You may shower over the dressings as they are waterproof.  Continue to shower over incision(s) after the dressing is off. °7. Remove your waterproof bandages 5 days after surgery.  You may leave the incision open to air.  You may replace a dressing/Band-Aid to cover the incision for comfort if you wish.  °8. ACTIVITIES as tolerated:   °a. You may resume regular (light) daily activities beginning the next day--such as daily self-care, walking, climbing stairs--gradually increasing activities as tolerated.  If you can walk 30 minutes without difficulty, it   is safe to try more intense activity such as jogging, treadmill, bicycling, low-impact aerobics, swimming, etc. b. Save the most intensive and strenuous activity for last such as sit-ups, heavy lifting, contact sports, etc  Refrain from any heavy lifting or straining until you are off narcotics for pain control.   c. DO NOT PUSH THROUGH PAIN.  Let pain be your guide: If it hurts to do something, don't  do it.  Pain is your body warning you to avoid that activity for another week until the pain goes down. d. You may drive when you are no longer taking prescription pain medication, you can comfortably wear a seatbelt, and you can safely maneuver your car and apply brakes. e. Dennis Bast may have sexual intercourse when it is comfortable.  9. FOLLOW UP in our office a. Please call [facility] at [phone] to set up an appointment to see your surgeon in the office for a follow-up appointment approximately 2-3 weeks after your surgery. b. Make sure that you call for this appointment the day you arrive home to insure a convenient appointment time. 10. IF YOU HAVE DISABILITY OR FAMILY LEAVE FORMS, BRING THEM TO THE OFFICE FOR PROCESSING.  DO NOT GIVE THEM TO YOUR DOCTOR.   WHEN TO CALL us [phone]: 1. Poor pain control 2. Reactions / problems with new medications (rash/itching, nausea, etc)  3. Fever over 101.5 F (38.5 C) 4. Inability to urinate 5. Nausea and/or vomiting 6. Worsening swelling or bruising 7. Continued bleeding from incision. 8. Increased pain, redness, or drainage from the incision   The clinic staff is available to answer your questions during regular business hours (8:30am-5pm).  Please dont hesitate to call and ask to speak to one of our nurses for clinical concerns.   If you have a medical emergency, go to the nearest emergency room or call 911.  A surgeon from [facility] is always on call at the [facility], [place], [place], [place], [place]  [id number] ? MAIN: [phone] ? TOLL FREE: [phone] ?  FAX [phone] [email or web address]  Cholecystitis Cholecystitis is an inflammation of your gallbladder. It is usually caused by a buildup of gallstones or sludge (cholelithiasis) in your gallbladder. The gallbladder stores a fluid that helps digest fats (bile). Cholecystitis is serious and needs  treatment right away.  CAUSES   Gallstones. Gallstones can block the tube that leads to your gallbladder, causing bile to build up. As bile builds up, the gallbladder becomes inflamed.  Bile duct problems, such as blockage from scarring or kinking.  Tumors. Tumors can stop bile from leaving your gallbladder correctly, causing bile to build up. As bile builds up, the gallbladder becomes inflamed. SYMPTOMS   Nausea.  Vomiting.  Abdominal pain, especially in the upper right area of your abdomen.  Abdominal tenderness or bloating.  Sweating.  Chills.  Fever.  Yellowing of the skin and the whites of the eyes (jaundice). DIAGNOSIS  Your caregiver may order blood tests to look for infection or gallbladder problems. Your caregiver may also order imaging tests, such as an ultrasound or computed tomography (CT) scan. Further tests may include a hepatobiliary iminodiacetic acid (HIDA) scan. This scan allows your caregiver to see your bile move from the liver to the gallbladder and to the small intestine. TREATMENT  A hospital stay is usually necessary to lessen the inflammation of your gallbladder. You may be required to not eat or drink (fast) for a  certain amount of time. You may be given medicine to treat pain or an antibiotic medicine to treat an infection. Surgery may be needed to remove your gallbladder (cholecystectomy) once the inflammation has gone down. Surgery may be needed right away if you develop complications such as death of gallbladder tissue (gangrene) or a tear (perforation) of the gallbladder.  Leon care will depend on your treatment. In general:  If you were given antibiotics, take them as directed. Finish them even if you start to feel better.  Only take over-the-counter or prescription medicines for pain, discomfort, or fever as directed by your caregiver.  Follow a low-fat diet until you see your caregiver again.  Keep all follow-up visits as  directed by your caregiver. SEEK IMMEDIATE MEDICAL CARE IF:   Your pain is increasing and not controlled by medicines.  Your pain moves to another part of your abdomen or to your back.  You have a fever.  You have nausea and vomiting. MAKE SURE YOU:  Understand these instructions.  Will watch your condition.  Will get help right away if you are not doing well or get worse. Document Released: 05/19/2005 Document Revised: 08/11/2011 Document Reviewed: 04/04/2011 [facility] Patient Information 2015 [place], [place]. This information is not intended to replace advice given to you by your health care provider. Make sure you discuss any questions you have with your health care provider.  GETTING TO GOOD BOWEL HEALTH. Irregular bowel habits such as constipation and diarrhea can lead to many problems over time.  Having one soft bowel movement a day is the most important way to prevent further problems.  The anorectal canal is designed to handle stretching and feces to safely manage our ability to get rid of solid waste (feces, poop, stool) out of our body.  BUT, hard constipated stools can act like ripping concrete bricks and diarrhea can be a burning fire to this very sensitive area of our body, causing inflamed hemorrhoids, anal fissures, increasing risk is perirectal abscesses, abdominal pain/bloating, an making irritable bowel worse.     The goal: ONE SOFT BOWEL MOVEMENT A DAY!  To have soft, regular bowel movements:   Drink at least 8 tall glasses of water a day.    Take plenty of fiber.  Fiber is the undigested part of plant food that passes into the colon, acting s natures broom to encourage bowel motility and movement.  Fiber can absorb and hold large amounts of water. This results in a larger, bulkier stool, which is soft and easier to pass. Work gradually over several weeks up to 6 servings a day of fiber (25g a day even more if needed) in the form of: o Vegetables -- Root (potatoes,  carrots, turnips), leafy green (lettuce, salad greens, celery, spinach), or cooked high residue (cabbage, broccoli, etc) o Fruit -- Fresh (unpeeled skin & pulp), Dried (prunes, apricots, cherries, etc ),  or stewed ( applesauce)  o Whole grain breads, pasta, etc (whole wheat)  o Bran cereals   Bulking Agents -- This type of water-retaining fiber generally is easily obtained each day by one of the following:  o Psyllium bran -- The psyllium plant is remarkable because its ground seeds can retain so much water. This product is available as Metamucil, Konsyl, Effersyllium, Per Diem Fiber, or the less expensive generic preparation in drug and health food stores. Although labeled a laxative, it really is not a laxative.  o Methylcellulose -- This is another fiber derived from  wood which also retains water. It is available as Citrucel. o Polyethylene Glycol - and artificial fiber commonly called Miralax or Glycolax.  It is helpful for people with gassy or bloated feelings with regular fiber o Flax Seed - a less gassy fiber than psyllium  No reading or other relaxing activity while on the toilet. If bowel movements take longer than 5 minutes, you are too constipated  AVOID CONSTIPATION.  High fiber and water intake usually takes care of this.  Sometimes a laxative is needed to stimulate more frequent bowel movements, but   Laxatives are not a good long-term solution as it can wear the colon out. o Osmotics (Milk of Magnesia, Fleets phosphosoda, Magnesium citrate, MiraLax, GoLytely) are safer than  o Stimulants (Senokot, Castor Oil, Dulcolax, Ex Lax)    o Do not take laxatives for more than 7days in a row.   IF SEVERELY CONSTIPATED, try a Bowel Retraining Program: o Do not use laxatives.  o Eat a diet high in roughage, such as bran cereals and leafy vegetables.  o Drink six (6) ounces of prune or apricot juice each morning.  o Eat two (2) large servings of stewed fruit each day.  o Take one (1)  heaping tablespoon of a psyllium-based bulking agent twice a day. Use sugar-free sweetener when possible to avoid excessive calories.  o Eat a normal breakfast.  o Set aside 15 minutes after breakfast to sit on the toilet, but do not strain to have a bowel movement.  o If you do not have a bowel movement by the third day, use an enema and repeat the above steps.   Controlling diarrhea o Switch to liquids and simpler foods for a few days to avoid stressing your intestines further. o Avoid dairy products (especially milk & ice cream) for a short time.  The intestines often can lose the ability to digest lactose when stressed. o Avoid foods that cause gassiness or bloating.  Typical foods include beans and other legumes, cabbage, broccoli, and dairy foods.  Every person has some sensitivity to other foods, so listen to our body and avoid those foods that trigger problems for you. o Adding fiber (Citrucel, Metamucil, psyllium, Miralax) gradually can help thicken stools by absorbing excess fluid and retrain the intestines to act more normally.  Slowly increase the dose over a few weeks.  Too much fiber too soon can backfire and cause cramping & bloating. o Probiotics (such as active yogurt, Align, etc) may help repopulate the intestines and colon with normal bacteria and calm down a sensitive digestive tract.  Most studies show it to be of mild help, though, and such products can be costly. o Medicines: - Bismuth subsalicylate (ex. Kayopectate, Pepto Bismol) every 30 minutes for up to 6 doses can help control diarrhea.  Avoid if pregnant. - Loperamide (Immodium) can slow down diarrhea.  Start with two tablets (4mg  total) first and then try one tablet every 6 hours.  Avoid if you are having fevers or severe pain.  If you are not better or start feeling worse, stop all medicines and call your doctor for advice o Call your doctor if you are getting worse or not better.  Sometimes further testing (cultures,  endoscopy, X-ray studies, bloodwork, etc) may be needed to help diagnose and treat the cause of the diarrhea. Managing Pain  Pain after surgery or related to activity is often due to strain/injury to muscle, tendon, nerves and/or incisions.  This pain is usually short-term and  will improve in a few months.   Many people find it helpful to do the following things TOGETHER to help speed the process of healing and to get back to regular activity more quickly:  Avoid heavy physical activity  no lifting greater than 20 pounds Do not push through the pain.  Listen to your body and avoid positions and maneuvers than reproduce the pain Walking is okay as tolerated, but go slowly and stop when getting sore.  Remember: If it hurts to do it, then dont do it! Take Anti-inflammatory medication  Take with food/snack around the clock for 1-2 weeks This helps the muscle and nerve tissues become less irritable and calm down faster Choose ONE of the following over-the-counter medications: Naproxen 220mg  tabs (ex. Aleve) 1-2 pills twice a day  Ibuprofen 200mg  tabs (ex. Advil, Motrin) 3-4 pills with every meal and just before bedtime Acetaminophen 500mg  tabs (Tylenol) 1-2 pills with every meal and just before bedtime Use a Heating pad or Ice/Cold Pack 4-6 times a day May use warm bath/hottub  or showers Try Gentle Massage and/or Stretching  at the area of pain many times a day stop if you feel pain - do not overdo it  Try these steps together to help you body heal faster and avoid making things get worse.  Doing just one of these things may not be enough.    If you are not getting better after two weeks or are noticing you are getting worse, contact our office for further advice; we may need to re-evaluate you & see what other things we can do to help.

## 2014-07-20 NOTE — Anesthesia Preprocedure Evaluation (Signed)
Anesthesia Evaluation  Patient identified by MRN, date of birth, ID band Patient awake    Reviewed: Allergy & Precautions, H&P , NPO status , Patient's Chart, lab work & pertinent test results  Airway Mallampati: II  TM Distance: >3 FB Neck ROM: full    Dental  (+) Implants, Dental Advisory Given Implant right upper front:   Pulmonary neg pulmonary ROS, former smoker,  breath sounds clear to auscultation  Pulmonary exam normal       Cardiovascular Exercise Tolerance: Good negative cardio ROS  Rhythm:regular Rate:Normal     Neuro/Psych negative neurological ROS  negative psych ROS   GI/Hepatic negative GI ROS, Neg liver ROS, GERD-  Medicated and Controlled,  Endo/Other  negative endocrine ROS  Renal/GU negative Renal ROS  negative genitourinary   Musculoskeletal   Abdominal   Peds  Hematology negative hematology ROS (+)   Anesthesia Other Findings   Reproductive/Obstetrics negative OB ROS                             Anesthesia Physical Anesthesia Plan  ASA: I  Anesthesia Plan: General   Post-op Pain Management:    Induction: Intravenous  Airway Management Planned: Oral ETT  Additional Equipment:   Intra-op Plan:   Post-operative Plan: Extubation in OR  Informed Consent: I have reviewed the patients History and Physical, chart, labs and discussed the procedure including the risks, benefits and alternatives for the proposed anesthesia with the patient or authorized representative who has indicated his/her understanding and acceptance.   Dental Advisory Given  Plan Discussed with: CRNA and Surgeon  Anesthesia Plan Comments:         Anesthesia Quick Evaluation
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
## 2014-07-20 NOTE — Anesthesia Procedure Notes (Signed)
Procedure Name: Intubation Date/Time: 07/20/2014 9:17 AM Performed by: Ofilia Neas Pre-anesthesia Checklist: Patient identified, Timeout performed, Emergency Drugs available, Suction available and Patient being monitored Patient Re-evaluated:Patient Re-evaluated prior to inductionOxygen Delivery Method: Circle system utilized Preoxygenation: Pre-oxygenation with 100% oxygen Intubation Type: IV induction and Cricoid Pressure applied Ventilation: Mask ventilation without difficulty Laryngoscope Size: Mac and 4 Grade View: Grade II Tube type: Oral Tube size: 7.5 mm Number of attempts: 1 Airway Equipment and Method: Stylet Placement Confirmation: ETT inserted through vocal cords under direct vision,  positive ETCO2 and breath sounds checked- equal and bilateral Secured at: 21 cm Tube secured with: Tape Dental Injury: Teeth and Oropharynx as per pre-operative assessment  Difficulty Due To: Difficulty was anticipated, Difficult Airway- due to large tongue, Difficult Airway- due to reduced neck mobility, Difficult Airway- due to limited oral opening and Difficult Airway- due to anterior larynx Future Recommendations: Recommend- induction with short-acting agent, and alternative techniques readily available Comments: Anterior.  Cords visualized with head lift/cricoid press

## 2014-07-20 NOTE — Transfer of Care (Signed)
Immediate Anesthesia Transfer of Care Note  Patient: Gregory Valencia  Procedure(s) Performed: Procedure(s) (LRB): LAPAROSCOPIC CHOLECYSTECTOMY SINGLE SITE WITH INTRAOPERATIVE CHOLANGIOGRAM (N/A)  Patient Location: PACU  Anesthesia Type: General  Level of Consciousness: sedated, patient cooperative and responds to stimulation  Airway & Oxygen Therapy: Patient Spontanous Breathing and Patient connected to face mask oxgen  Post-op Assessment: Report given to PACU RN and Post -op Vital signs reviewed and stable  Post vital signs: Reviewed and stable  Complications: No apparent anesthesia complications

## 2014-07-20 NOTE — Op Note (Signed)
07/20/2014  10:49 AM  PATIENT:  Gregory Valencia  49 y.o. male  Patient Care Team: Tonia Ghent, MD as PCP - General (Family Medicine)  PRE-OPERATIVE DIAGNOSIS:  Biliary Colic  POST-OPERATIVE DIAGNOSIS:  Biliary Colic, probable chronic cholecystitis  PROCEDURE:  Procedure(s): LAPAROSCOPIC CHOLECYSTECTOMY SINGLE SITE WITH INTRAOPERATIVE CHOLANGIOGRAM  SURGEON:  Surgeon(s): [name], MD  ASSISTANT: RN   ANESTHESIA:   local and general  EBL:  Total I/O In: 1000 [I.V.:1000] Out: -   Delay start of Pharmacological VTE agent (>24hrs) due to surgical blood loss or risk of bleeding:  no  DRAINS: none   SPECIMEN:  Source of Specimen:  Gallbladder   DISPOSITION OF SPECIMEN:  PATHOLOGY  COUNTS:  YES  PLAN OF CARE: Discharge to home after PACU  PATIENT DISPOSITION:  PACU - hemodynamically stable.  INDICATION: Patient with recurrent biliary colic and probable chronic cholecystitis. I recommended cholecystectomy:  The anatomy & physiology of hepatobiliary & pancreatic function was discussed.  The pathophysiology of gallbladder dysfunction was discussed.  Natural history risks without surgery was discussed.   I feel the risks of no intervention will lead to serious problems that outweigh the operative risks; therefore, I recommended cholecystectomy to remove the pathology.  I explained laparoscopic techniques with possible need for an open approach.  Probable cholangiogram to evaluate the bilary tract was explained as well.    Risks such as bleeding, infection, abscess, leak, injury to other organs, need for further treatment, heart attack, death, and other risks were discussed.  I noted a good likelihood this will help address the problem.  Possibility that this will not correct all abdominal symptoms was explained.  Goals of post-operative recovery were discussed as well.  We will work to minimize complications.  An educational handout further explaining the pathology and treatment  options was given as well.  Questions were answered.  The patient expresses understanding & wishes to proceed with surgery.   OR FINDINGS: Moderate adhesions to liver and gallbladder suspicious for prior attacks and chronic cholecystitis. Mildly narrowed biliary system but otherwise classic anatomy. No evidence of choledocholithiasis.  DESCRIPTION:   The patient was identified & brought in the operating room. The patient was positioned supine with arms tucked. SCDs were active during the entire case. The patient underwent general anesthesia without any difficulty.  The abdomen was prepped and draped in a sterile fashion. A Surgical Timeout confirmed our plan.  I made a transverse curvilinear incision through the superior umbilical fold.  I placed a 53mm long port through the supraumbilical fascia using a modified Hassan cutdown technique. I began carbon dioxide insufflation. Camera inspection revealed no injury. There were no adhesions to the anterior abdominal wall supraumbilically.  I proceeded to continue with single site technique. I placed a #5 port in left upper aspect of the wound. I placed a 5 mm atraumatic grasper in the right inferior aspect of the wound.  I turned attention to the right upper quadrant.  I freed some greater omental adhesions off the anterior bowel wall, liver, endopelvic gallbladder.  The gallbladder fundus was elevated cephalad. An free more greater omentum off the anterior gallbladder and infundibulum to expose the infundibulum. I freed the peritoneal coverings between the gallbladder and the liver on the posteriolateral and anteriomedial walls. I alternated between Harmonic & blunt Maryland dissection to help get a good critical view of the cystic artery and cystic duct. I did further dissection to free a few centimeters of the  gallbladder off the  liver bed to get a good critical view of the infundibulum and cystic duct. I mobilized the cystic artery; and, after getting a  good 360 view, ligated the cystic artery using the Harmonic ultrasonic dissection. I skeletonized the cystic duct.  I placed a clip on the infundibulum. I did a partial cystic duct-otomy and ensured patency. I placed a 5 [place] cholangiocatheter through a puncture site at the right subcostal ridge of the abdominal wall and directed it into the cystic duct.  We ran a cholangiogram with dilute radio-opaque contrast and continuous fluoroscopy. Contrast flowed from a side branch consistent with cystic duct cannulization. Contrast flowed up the common hepatic duct into the right and left intrahepatic chains out to secondary radicals. Contrast flowed down the common bile duct easily across the normal ampulla into the duodenum.  This was consistent with a normal cholangiogram.  I removed the cholangiocatheter. I placed clips on the cystic duct x4.   I completed cystic duct transection. I freed the gallbladder from its remaining attachments to the liver. I ensured hemostasis on the gallbladder fossa of the liver and elsewhere. Mild superficial venous bleeder in the gallbladder fossa controlled with point cautery. Well away from the porta hepatis. I inspected the rest of the abdomen & detected no injury nor bleeding elsewhere.  I removed the gallbladder out the supraumbilical fascia. I had to dilate the wound to 2 cm to get the gallbladder with its  impacted stones out.  I closed the fascia transversely using #1 PDS & 0 Vicryl interrupted stitch. A closed the skin using 4-0 monocryl stitch.  Sterile dressing was applied. The patient was extubated & arrived in the PACU in stable condition..  I had discussed postoperative care with the patient in the holding area. I am about to locate the patient's family and discuss operative findings and postoperative goals / instructions.  Instructions are written in the chart as well.  Adin Hector, M.D., F.A.C.S. Gastrointestinal and Minimally Invasive Surgery [facility] [address] [place], [place] [address] [phone] Main / Paging

## 2014-07-21 ENCOUNTER — Encounter (HOSPITAL_COMMUNITY): Payer: Self-pay | Admitting: Surgery

## 2014-07-30 ENCOUNTER — Other Ambulatory Visit: Payer: Self-pay | Admitting: Physician Assistant

## 2014-10-04 ENCOUNTER — Encounter: Payer: Self-pay | Admitting: Primary Care

## 2014-10-04 ENCOUNTER — Ambulatory Visit (INDEPENDENT_AMBULATORY_CARE_PROVIDER_SITE_OTHER): Payer: 59 | Admitting: Primary Care

## 2014-10-04 VITALS — BP 122/72 | HR 74 | Temp 98.2°F | Ht 73.25 in | Wt 239.4 lb

## 2014-10-04 DIAGNOSIS — R05 Cough: Secondary | ICD-10-CM | POA: Diagnosis not present

## 2014-10-04 DIAGNOSIS — R059 Cough, unspecified: Secondary | ICD-10-CM

## 2014-10-04 MED ORDER — HYDROCOD POLST-CPM POLST ER 10-8 MG/5ML PO SUER: 5.0000 mL | Freq: Every evening | ORAL | Status: DC | PRN

## 2014-10-04 MED ORDER — BENZONATATE 200 MG PO CAPS: 200.0000 mg | ORAL_CAPSULE | Freq: Three times a day (TID) | ORAL | Status: DC | PRN

## 2014-10-04 NOTE — Progress Notes (Signed)
Subjective:    Patient ID: Gregory Valencia, male    DOB: 1965-12-12, 49 y.o.   MRN: [id number]  HPI  Mr. Ludtke is a 49 year old male who presents today with a chief complaint of non-productive cough. His cough began yesterday morning with tightness to his throat. Last night he had difficulty sleeping due to cough. He's taken Delsym and cough drops with temporary relief. He also took some Advil this morning for body aches. His wife was recently diagnosed with Bronchitis and placed on antibiotics. Denies nausea, vomiting, fevers.  Review of Systems  Constitutional: Negative for fever and chills.  HENT: Positive for sore throat. Negative for congestion, ear pain, postnasal drip, rhinorrhea and sinus pressure.   Respiratory: Positive for cough. Negative for shortness of breath.   Cardiovascular: Negative for chest pain.  Gastrointestinal: Negative for nausea and vomiting.  Musculoskeletal: Positive for myalgias.       Past Medical History  Diagnosis Date  . Heartburn   . GERD (gastroesophageal reflux disease)   . Anxiety     MVI 07/12/14    History   Social History  . Marital Status: Married    Spouse Name: N/A  . Number of Children: 1  . Years of Education: N/A   Occupational History  . Truckdriver    Social History Main Topics  . Smoking status: Former Smoker    Quit date: 06/02/2005  . Smokeless tobacco: Never Used     Comment: quit 2007  . Alcohol Use: 0.0 oz/week    0 Standard drinks or equivalent per week     Comment: occ on weekend  . Drug Use: No  . Sexual Activity: Not on file   Other Topics Concern  . Not on file   Social History Narrative   Married//remarried lives with wife   One child from previous marriage lives with mother    Past Surgical History  Procedure Laterality Date  . Arm surgery Left   . Upper gi endoscopy    . Colonoscopy    . Laparoscopic cholecystectomy single site with intraoperative cholangiogram N/A 07/20/2014    Procedure:  LAPAROSCOPIC CHOLECYSTECTOMY SINGLE SITE WITH INTRAOPERATIVE CHOLANGIOGRAM;  Surgeon: [name], MD;  Location: [facility];  Service: General;  Laterality: N/A;    Family History  Problem Relation Age of Onset  . Hypertension Father   . Heart disease Father     MI x 2, CHF  . Depression Brother   . Stroke Father   . Colon cancer Neg Hx   . Colon polyps Neg Hx   . Diabetes Neg Hx   . Kidney disease Neg Hx   . Esophageal cancer Neg Hx     No Known Allergies  Current Outpatient Prescriptions on File Prior to Visit  Medication Sig Dispense Refill  . Multiple Vitamin (MULTI VITAMIN DAILY PO) Take 1 tablet by mouth daily.    . pantoprazole (PROTONIX) 40 MG tablet TAKE 1 TABLET (40 MG TOTAL) BY MOUTH EVERY MORNING. BEFORE BREAKFAST 30 tablet 3  . cyclobenzaprine (FLEXERIL) 10 MG tablet Take 0.5-1 tablets (5-10 mg total) by mouth 3 (three) times daily as needed for muscle spasms (sedation caution). (Patient not taking: Reported on 10/04/2014) 20 tablet 0   No current facility-administered medications on file prior to visit.    BP 122/72 mmHg  Pulse 74  Temp(Src) 98.2 F (36.8 C) (Oral)  Ht 6' 1.25" (1.861 m)  Wt 239 lb 6.4 oz (108.591 kg)  BMI 31.35 kg/m2  SpO2 97%    Objective:   Physical Exam  Constitutional: He is oriented to person, place, and time. He appears well-developed.  HENT:  Right Ear: Tympanic membrane and ear canal normal.  Left Ear: Tympanic membrane and ear canal normal.  Nose: Nose normal.  Mouth/Throat: Oropharynx is clear and moist.  Eyes: Conjunctivae are normal. Pupils are equal, round, and reactive to light.  Neck: Neck supple.  Cardiovascular: Normal rate and regular rhythm.   Pulmonary/Chest: Effort normal and breath sounds normal. He has no wheezes. He has no rhonchi. He has no rales.  Lymphadenopathy:    He has no cervical adenopathy.  Neurological: He is alert and oriented to person, place, and time.  Skin: Skin is warm and dry.            Assessment & Plan:  Upper respiratory infection:  Suspect viral involvement at this point from exam and duration of symptoms. RX for Tessalon Pearls for daytime cough, Tussionex at bedtime for cough and rest. Mucinex DM for chest congestion, continue Advil for body aches. Instructed patient to notify me if no improvement or worsening symptoms by Monday next week.

## 2014-10-04 NOTE — Patient Instructions (Signed)
Start taking the Tessalon Pearls three times daily as needed for cough. You may take the Tussionex at bedtime for cough and rest. Your symptoms are likely to worsen over the next few days, but you should be feeling better by next Monday or Tuesday. Please call me if your symptoms worsen on Monday/Tuesday. It was nice meeting you!  Upper Respiratory Infection, Adult An upper respiratory infection (URI) is also sometimes known as the common cold. The upper respiratory tract includes the nose, sinuses, throat, trachea, and bronchi. Bronchi are the airways leading to the lungs. Most people improve within 1 week, but symptoms can last up to 2 weeks. A residual cough may last even longer.  CAUSES Many different viruses can infect the tissues lining the upper respiratory tract. The tissues become irritated and inflamed and often become very moist. Mucus production is also common. A cold is contagious. You can easily spread the virus to others by oral contact. This includes kissing, sharing a glass, coughing, or sneezing. Touching your mouth or nose and then touching a surface, which is then touched by another person, can also spread the virus. SYMPTOMS  Symptoms typically develop 1 to 3 days after you come in contact with a cold virus. Symptoms vary from person to person. They may include:  Runny nose.  Sneezing.  Nasal congestion.  Sinus irritation.  Sore throat.  Loss of voice (laryngitis).  Cough.  Fatigue.  Muscle aches.  Loss of appetite.  Headache.  Low-grade fever. DIAGNOSIS  You might diagnose your own cold based on familiar symptoms, since most people get a cold 2 to 3 times a year. Your caregiver can confirm this based on your exam. Most importantly, your caregiver can check that your symptoms are not due to another disease such as strep throat, sinusitis, pneumonia, asthma, or epiglottitis. Blood tests, throat tests, and X-rays are not necessary to diagnose a common cold, but  they may sometimes be helpful in excluding other more serious diseases. Your caregiver will decide if any further tests are required. RISKS AND COMPLICATIONS  You may be at risk for a more severe case of the common cold if you smoke cigarettes, have chronic heart disease (such as heart failure) or lung disease (such as asthma), or if you have a weakened immune system. The very young and very old are also at risk for more serious infections. Bacterial sinusitis, middle ear infections, and bacterial pneumonia can complicate the common cold. The common cold can worsen asthma and chronic obstructive pulmonary disease (COPD). Sometimes, these complications can require emergency medical care and may be life-threatening. PREVENTION  The best way to protect against getting a cold is to practice good hygiene. Avoid oral or hand contact with people with cold symptoms. Wash your hands often if contact occurs. There is no clear evidence that vitamin C, vitamin E, echinacea, or exercise reduces the chance of developing a cold. However, it is always recommended to get plenty of rest and practice good nutrition. TREATMENT  Treatment is directed at relieving symptoms. There is no cure. Antibiotics are not effective, because the infection is caused by a virus, not by bacteria. Treatment may include:  Increased fluid intake. Sports drinks offer valuable electrolytes, sugars, and fluids.  Breathing heated mist or steam (vaporizer or shower).  Eating chicken soup or other clear broths, and maintaining good nutrition.  Getting plenty of rest.  Using gargles or lozenges for comfort.  Controlling fevers with ibuprofen or acetaminophen as directed by your caregiver.  Increasing usage of your inhaler if you have asthma. Zinc gel and zinc lozenges, taken in the first 24 hours of the common cold, can shorten the duration and lessen the severity of symptoms. Pain medicines may help with fever, muscle aches, and throat  pain. A variety of non-prescription medicines are available to treat congestion and runny nose. Your caregiver can make recommendations and may suggest nasal or lung inhalers for other symptoms.  HOME CARE INSTRUCTIONS   Only take over-the-counter or prescription medicines for pain, discomfort, or fever as directed by your caregiver.  Use a warm mist humidifier or inhale steam from a shower to increase air moisture. This may keep secretions moist and make it easier to breathe.  Drink enough water and fluids to keep your urine clear or pale yellow.  Rest as needed.  Return to work when your temperature has returned to normal or as your caregiver advises. You may need to stay home longer to avoid infecting others. You can also use a face mask and careful hand washing to prevent spread of the virus. SEEK MEDICAL CARE IF:   After the first few days, you feel you are getting worse rather than better.  You need your caregiver's advice about medicines to control symptoms.  You develop chills, worsening shortness of breath, or brown or red sputum. These may be signs of pneumonia.  You develop yellow or brown nasal discharge or pain in the face, especially when you bend forward. These may be signs of sinusitis.  You develop a fever, swollen neck glands, pain with swallowing, or white areas in the back of your throat. These may be signs of strep throat. SEEK IMMEDIATE MEDICAL CARE IF:   You have a fever.  You develop severe or persistent headache, ear pain, sinus pain, or chest pain.  You develop wheezing, a prolonged cough, cough up blood, or have a change in your usual mucus (if you have chronic lung disease).  You develop sore muscles or a stiff neck. Document Released: 11/12/2000 Document Revised: 08/11/2011 Document Reviewed: 08/24/2013 [facility] Patient Information 2015 [place], [place]. This information is not intended to replace advice given to you by your health care provider. Make sure  you discuss any questions you have with your health care provider.

## 2014-10-04 NOTE — Progress Notes (Signed)
Pre visit review using our clinic review tool, if applicable. No additional management support is needed unless otherwise documented below in the visit note. 

## 2014-11-27 ENCOUNTER — Other Ambulatory Visit: Payer: Self-pay | Admitting: Gastroenterology

## 2015-02-22 ENCOUNTER — Other Ambulatory Visit: Payer: Self-pay | Admitting: Gastroenterology

## 2015-03-06 ENCOUNTER — Telehealth: Payer: Self-pay | Admitting: Gastroenterology

## 2015-03-06 MED ORDER — PANTOPRAZOLE SODIUM 40 MG PO TBEC: DELAYED_RELEASE_TABLET | ORAL | Status: DC

## 2015-03-06 NOTE — Telephone Encounter (Signed)
Prescription refill sent to patient's pharmacy and pt notified.

## 2015-04-23 ENCOUNTER — Other Ambulatory Visit: Payer: Self-pay

## 2015-04-23 ENCOUNTER — Ambulatory Visit (INDEPENDENT_AMBULATORY_CARE_PROVIDER_SITE_OTHER): Payer: 59 | Admitting: Gastroenterology

## 2015-04-23 ENCOUNTER — Encounter: Payer: Self-pay | Admitting: Gastroenterology

## 2015-04-23 VITALS — BP 104/70 | HR 100 | Ht 73.0 in | Wt 248.6 lb

## 2015-04-23 DIAGNOSIS — R152 Fecal urgency: Secondary | ICD-10-CM

## 2015-04-23 DIAGNOSIS — K219 Gastro-esophageal reflux disease without esophagitis: Secondary | ICD-10-CM | POA: Diagnosis not present

## 2015-04-23 MED ORDER — HYOSCYAMINE SULFATE 0.125 MG SL SUBL: 0.2500 mg | SUBLINGUAL_TABLET | Freq: Three times a day (TID) | SUBLINGUAL | Status: DC

## 2015-04-23 MED ORDER — PANTOPRAZOLE SODIUM 40 MG PO TBEC: DELAYED_RELEASE_TABLET | ORAL | Status: DC

## 2015-04-23 NOTE — Patient Instructions (Signed)
We have sent the following medications to your pharmacy for you to pick up at your convenience: Levsin 0.125 2 with each meal.  Thank you for choosing me and [facility].  Pricilla Riffle. Dagoberto Ligas., MD., Marval Regal

## 2015-04-23 NOTE — Progress Notes (Signed)
    History of Present Illness: This is a 49 year old male returning for follow-up of GERD. EGD in January 2016 was normal. He underwent laparoscopic cholecystectomy in February 2016. His reflux symptoms are well controlled on pantoprazole 40 mg daily.  He relates ongoing problems with fecal urgency following meals. He notes coffee and certain other foods exacerbate his symptoms but his symptoms happen frequently almost after every meal. The symptoms were present prior to his cholecystectomy and prior to his colonoscopy. They have not changed significantly.  Current Medications, Allergies, Past Medical History, Past Surgical History, Family History and Social History were reviewed in [employer] record.  Physical Exam: General: Well developed, well nourished, no acute distress Head: Normocephalic and atraumatic Eyes:  sclerae anicteric, EOMI Ears: Normal auditory acuity Mouth: No deformity or lesions Lungs: Clear throughout to auscultation Heart: Regular rate and rhythm; no murmurs, rubs or bruits Abdomen: Soft, non tender and non distended. No masses, hepatosplenomegaly or hernias noted. Normal Bowel sounds Musculoskeletal: Symmetrical with no gross deformities  Pulses:  Normal pulses noted Extremities: No clubbing, cyanosis, edema or deformities noted Neurological: Alert oriented x 4, grossly nonfocal Psychological:  Alert and cooperative. Normal mood and affect  Assessment and Recommendations:  1. GERD. Continue pantoprazole 40 mg daily and standard antireflux measures.  2.  Fecal urgency post meals. Minimize foods that exacerbate symptoms. Particular attention to coffee, other caffeinated beverages, milk products, spicy foods and high fat foods. Trial of Levsin 1-2 before meals. Patient advised to call office if symptoms are not improved with Levsin.  3. CRC screening, average risk. Colonoscopy in January 2016 was normal. Repeat screening colonoscopy in 10 years,  January 2026.

## 2016-04-28 ENCOUNTER — Other Ambulatory Visit: Payer: Self-pay | Admitting: Gastroenterology

## 2016-05-27 ENCOUNTER — Encounter (INDEPENDENT_AMBULATORY_CARE_PROVIDER_SITE_OTHER): Payer: Self-pay

## 2016-05-27 ENCOUNTER — Other Ambulatory Visit: Payer: Self-pay | Admitting: Gastroenterology

## 2016-05-27 ENCOUNTER — Encounter: Payer: Self-pay | Admitting: Gastroenterology

## 2016-05-27 ENCOUNTER — Ambulatory Visit (INDEPENDENT_AMBULATORY_CARE_PROVIDER_SITE_OTHER): Payer: 59 | Admitting: Gastroenterology

## 2016-05-27 VITALS — BP 104/74 | HR 60 | Ht 73.0 in | Wt 257.4 lb

## 2016-05-27 DIAGNOSIS — K219 Gastro-esophageal reflux disease without esophagitis: Secondary | ICD-10-CM | POA: Diagnosis not present

## 2016-05-27 DIAGNOSIS — R152 Fecal urgency: Secondary | ICD-10-CM

## 2016-05-27 MED ORDER — PANTOPRAZOLE SODIUM 40 MG PO TBEC: DELAYED_RELEASE_TABLET | ORAL | 11 refills | Status: DC

## 2016-05-27 NOTE — Patient Instructions (Signed)
We have sent the following medications to your pharmacy for you to pick up at your convenience: Pantoprazole  We have given you an anti-reflux meausres handout.

## 2016-05-27 NOTE — Progress Notes (Signed)
    History of Present Illness: This is a 50 year old male returning for follow-up of GERD. EGD in 06/2014 was normal. He has occasional heartburn symptoms but in general symptoms are under very good control on his current regimen. He has occasional fecal urgency and loose stools in the morning. Hyoscyamine did not help his symptoms so he has not used it. Colonoscopy in 06/2014 was normal except for internal hemorrhoids. Random biopsies were unremarkable.  Current Medications, Allergies, Past Medical History, Past Surgical History, Family History and Social History were reviewed in [employer] record.  Physical Exam: General: Well developed, well nourished, no acute distress Head: Normocephalic and atraumatic Eyes:  sclerae anicteric, EOMI Ears: Normal auditory acuity Mouth: No deformity or lesions Lungs: Clear throughout to auscultation Heart: Regular rate and rhythm; no murmurs, rubs or bruits Abdomen: Soft, non tender and non distended. No masses, hepatosplenomegaly or hernias noted. Normal Bowel sounds Musculoskeletal: Symmetrical with no gross deformities  Pulses:  Normal pulses noted Extremities: No clubbing, cyanosis, edema or deformities noted Neurological: Alert oriented x 4, grossly nonfocal Psychological:  Alert and cooperative. Normal mood and affect  Assessment and Recommendations:  1. GERD. Continue antireflux measures and pantoprazole 40 mg daily. REV in 1 year.   2. Fecal urgency and loose stools. Patient states his symptoms are not that bothersome and he is not interested in trying other medications at this time. If his symptoms worsen consider dicyclomine 10-20 mg before meals as needed.

## 2017-05-29 ENCOUNTER — Other Ambulatory Visit: Payer: Self-pay | Admitting: Nurse Practitioner

## 2017-05-29 DIAGNOSIS — Z20828 Contact with and (suspected) exposure to other viral communicable diseases: Secondary | ICD-10-CM

## 2017-05-29 MED ORDER — OSELTAMIVIR PHOSPHATE 75 MG PO CAPS: 75.0000 mg | ORAL_CAPSULE | Freq: Every day | ORAL | 0 refills | Status: DC

## 2017-05-29 NOTE — Progress Notes (Signed)
Patient is here with his wife today: Gregory Valencia. She tested positive for influenza A. He will like to have prophylaxis treatement. Rx sent.

## 2017-06-03 ENCOUNTER — Ambulatory Visit: Payer: 59 | Admitting: Gastroenterology

## 2017-06-03 ENCOUNTER — Ambulatory Visit (INDEPENDENT_AMBULATORY_CARE_PROVIDER_SITE_OTHER): Payer: 59 | Admitting: Gastroenterology

## 2017-06-03 ENCOUNTER — Telehealth: Payer: Self-pay | Admitting: Gastroenterology

## 2017-06-03 ENCOUNTER — Encounter: Payer: Self-pay | Admitting: Gastroenterology

## 2017-06-03 VITALS — BP 126/82 | HR 76 | Ht 73.0 in | Wt 259.0 lb

## 2017-06-03 DIAGNOSIS — E739 Lactose intolerance, unspecified: Secondary | ICD-10-CM | POA: Diagnosis not present

## 2017-06-03 DIAGNOSIS — K219 Gastro-esophageal reflux disease without esophagitis: Secondary | ICD-10-CM | POA: Diagnosis not present

## 2017-06-03 MED ORDER — PANTOPRAZOLE SODIUM 40 MG PO TBEC: DELAYED_RELEASE_TABLET | ORAL | 11 refills | Status: DC

## 2017-06-03 MED ORDER — OMEPRAZOLE 40 MG PO CPDR: 40.0000 mg | DELAYED_RELEASE_CAPSULE | Freq: Every day | ORAL | 11 refills | Status: DC

## 2017-06-03 NOTE — Telephone Encounter (Signed)
Prescription for omeprazole has been sent to the pharmacy in the place of pantoprazole. Attempted to contact patient's wife to notify with no answer and voice mailbox is full and cannot leave a message.

## 2017-06-03 NOTE — Patient Instructions (Signed)
We have sent the following medications to your pharmacy for you to pick up at your convenience: pantoprazole.   Thank you for choosing me and [facility].  Malcolm T. Stark, Jr., MD., FACG   

## 2017-06-03 NOTE — Progress Notes (Signed)
    History of Present Illness: This is a 52 year old male with GERD. EGD in 2016 was normal. Colonoscopy 2016 was normal except for internal hemorrhoids.  He states his reflux symptoms are under very good control on daily pantoprazole.  Per the recommendations he states he weaned and discontinued pantoprazole this summer however his reflux symptoms were immediately active any resume daily dosing.  He notes that milk products lead to loose stools so he avoids them.  Current Medications, Allergies, Past Medical History, Past Surgical History, Family History and Social History were reviewed in [employer] record.  Physical Exam: General: Well developed, well nourished, no acute distress Head: Normocephalic and atraumatic Eyes:  sclerae anicteric, EOMI Ears: Normal auditory acuity Mouth: No deformity or lesions Lungs: Clear throughout to auscultation Heart: Regular rate and rhythm; no murmurs, rubs or bruits Abdomen: Soft, non tender and non distended. No masses, hepatosplenomegaly or hernias noted. Normal Bowel sounds Rectal: not done Musculoskeletal: Symmetrical with no gross deformities  Pulses:  Normal pulses noted Extremities: No clubbing, cyanosis, edema or deformities noted Neurological: Alert oriented x 4, grossly nonfocal Psychological:  Alert and cooperative. Normal mood and affect  Assessment and Recommendations:  1. GERD.  Continue pantoprazole 40 mg daily.  Follow antireflux measures. REV in 1 year.  2. Lactose intolerance.  Avoid lactose products.

## 2017-06-25 ENCOUNTER — Ambulatory Visit: Payer: 59 | Admitting: Family Medicine

## 2017-06-30 ENCOUNTER — Encounter: Payer: Self-pay | Admitting: Family Medicine

## 2017-06-30 ENCOUNTER — Ambulatory Visit (INDEPENDENT_AMBULATORY_CARE_PROVIDER_SITE_OTHER): Payer: 59 | Admitting: Family Medicine

## 2017-06-30 VITALS — BP 106/86 | HR 86 | Temp 98.5°F | Wt 257.5 lb

## 2017-06-30 DIAGNOSIS — Z8249 Family history of ischemic heart disease and other diseases of the circulatory system: Secondary | ICD-10-CM

## 2017-06-30 DIAGNOSIS — R040 Epistaxis: Secondary | ICD-10-CM

## 2017-06-30 DIAGNOSIS — Z0000 Encounter for general adult medical examination without abnormal findings: Secondary | ICD-10-CM

## 2017-06-30 DIAGNOSIS — Z7189 Other specified counseling: Secondary | ICD-10-CM

## 2017-06-30 LAB — LIPID PANEL
CHOL/HDL RATIO: 5
Cholesterol: 194 mg/dL (ref 0–200)
HDL: 37.5 mg/dL — ABNORMAL LOW (ref >39.00)
NonHDL: 156.97
Triglycerides: 233 mg/dL — ABNORMAL HIGH (ref 0.0–149.0)
VLDL: 46.6 mg/dL — ABNORMAL HIGH (ref 0.0–40.0)

## 2017-06-30 LAB — BASIC METABOLIC PANEL
BUN: 18 mg/dL (ref 6–23)
CHLORIDE: 104 meq/L (ref 96–112)
CO2: 28 mEq/L (ref 19–32)
Calcium: 9.7 mg/dL (ref 8.4–10.5)
Creatinine, Ser: 1.25 mg/dL (ref 0.40–1.50)
GFR: 64.14 mL/min (ref >60.00)
GLUCOSE: 99 mg/dL (ref 70–99)
POTASSIUM: 4.6 meq/L (ref 3.5–5.1)
Sodium: 138 mEq/L (ref 135–145)

## 2017-06-30 LAB — LDL CHOLESTEROL, DIRECT: LDL DIRECT: 116 mg/dL

## 2017-06-30 NOTE — Progress Notes (Signed)
CPE- See plan.  Routine anticipatory guidance given to patient.  See health maintenance.  The possibility exists that previously documented standard health maintenance information may have been brought forward from a previous encounter into this note.  If needed, that same information has been updated to reflect the current situation based on today's encounter.    Tetanus done <10 years ago per patient.   Flu encouraged.  Declined.  PNA not due Shingles not due.   Living will d/w pt.  Wife designated if patient were incapacitated.   Diet and exercise d/w pt.  Lots of loading/lifting at work.  Diet is "pretty good."  Sandwiches during the week, rye bread.   Colonoscopy 2016 Prostate cancer screening and PSA options (with potential risks and benefits of testing vs not testing) were discussed along with recent recs/guidelines.  He declined testing PSA at this point. HIV and HCV screening [facility] early [facility].   Labs pending given his FH.    He has occ L sided nose bleeds.  Stops in a few seconds. No recent sx.  Nonsmoker, not smoking in the last 12 years.  Tends to happen more in the winter with dry heat/when he had a cold or summer with sig dust exposure.  He uses a humidifier.  He hasn't tried nasal saline.  No h/o bleeding disorder, no other bleeding issues.  He may go months between episodes.    He has routine f/u with derm yearly.    PMH and SH reviewed  Meds, vitals, and allergies reviewed.   ROS: Per HPI.  Unless specifically indicated otherwise in HPI, the patient denies:  General: fever. Eyes: acute vision changes ENT: sore throat Cardiovascular: chest pain Respiratory: SOB GI: vomiting GU: dysuria Musculoskeletal: acute back pain Derm: acute rash Neuro: acute motor dysfunction Psych: worsening mood Endocrine: polydipsia Heme: bleeding Allergy: hayfever  GEN: nad, alert and oriented HEENT: mucous membranes moist, nasal exam unremarkable.  No bleeding. NECK:  supple w/o LA CV: rrr. PULM: ctab, no inc wob ABD: soft, +bs EXT: no edema SKIN: no acute rash

## 2017-06-30 NOTE — Patient Instructions (Signed)
Go to the lab on the way out.  We'll contact you with your lab report. Take care.  Glad to see you.  Update me as needed.   

## 2017-07-01 DIAGNOSIS — Z7189 Other specified counseling: Secondary | ICD-10-CM | POA: Insufficient documentation

## 2017-07-01 DIAGNOSIS — Z8249 Family history of ischemic heart disease and other diseases of the circulatory system: Secondary | ICD-10-CM | POA: Insufficient documentation

## 2017-07-01 DIAGNOSIS — R040 Epistaxis: Secondary | ICD-10-CM | POA: Insufficient documentation

## 2017-07-01 DIAGNOSIS — Z0000 Encounter for general adult medical examination without abnormal findings: Secondary | ICD-10-CM | POA: Insufficient documentation

## 2017-07-01 NOTE — Assessment & Plan Note (Signed)
Living will d/w pt.  Wife designated if patient were incapacitated.   ?

## 2017-07-01 NOTE — Assessment & Plan Note (Signed)
See notes on labs.  Discussed with patient about diet and exercise.

## 2017-07-01 NOTE — Assessment & Plan Note (Signed)
Tetanus done <10 years ago per patient.   Flu encouraged.  Declined.  PNA not due Shingles not due.   Living will d/w pt.  Wife designated if patient were incapacitated.   Diet and exercise d/w pt.  Lots of loading/lifting at work.  Diet is "pretty good."  Sandwiches during the week, try bread.   Colonoscopy 2016 Prostate cancer screening and PSA options (with potential risks and benefits of testing vs not testing) were discussed along with recent recs/guidelines.  He declined testing PSA at this point. HIV and HCV screening [facility].   Labs pending given his FH.

## 2017-07-01 NOTE — Assessment & Plan Note (Signed)
History of, no current symptoms.  He can go months without symptoms.  He usually has symptoms with cold air in the winter or after significant dust exposure in the summertime.  Would be okay to use nasal saline as needed.  Update me as needed.  If he continues to have trouble we can refer him over to ENT.  He agrees with all of that.

## 2018-06-08 ENCOUNTER — Other Ambulatory Visit: Payer: Self-pay | Admitting: Gastroenterology

## 2018-06-09 ENCOUNTER — Telehealth: Payer: Self-pay | Admitting: Gastroenterology

## 2018-06-09 NOTE — Telephone Encounter (Signed)
Pt wife called and stated pt need refill for medication pantoprazole (PROTONIX) 40 MG tablet [[id number] Pt is sched to see dr.stark on 07/01/2018 8:30am

## 2018-06-10 MED ORDER — PANTOPRAZOLE SODIUM 40 MG PO TBEC: DELAYED_RELEASE_TABLET | ORAL | 0 refills | Status: DC

## 2018-06-10 NOTE — Telephone Encounter (Signed)
Prescription sent to patient's pharmacy and patient notified to keep appt for further refills. 

## 2018-07-01 ENCOUNTER — Ambulatory Visit: Payer: BLUE CROSS/BLUE SHIELD | Admitting: Gastroenterology

## 2018-07-01 ENCOUNTER — Encounter: Payer: Self-pay | Admitting: Gastroenterology

## 2018-07-01 VITALS — BP 120/80 | HR 64 | Ht 73.25 in | Wt 264.0 lb

## 2018-07-01 DIAGNOSIS — K219 Gastro-esophageal reflux disease without esophagitis: Secondary | ICD-10-CM

## 2018-07-01 MED ORDER — PANTOPRAZOLE SODIUM 40 MG PO TBEC: DELAYED_RELEASE_TABLET | ORAL | 11 refills | Status: DC

## 2018-07-01 NOTE — Patient Instructions (Signed)
We have sent the following medications to your pharmacy for you to pick up at your convenience: pantoprazole.   Thank you for choosing me and [facility].  Malcolm T. Stark, Jr., MD., FACG   

## 2018-07-01 NOTE — Progress Notes (Signed)
    History of Present Illness: This is a 53 year old male with GERD.  His reflux symptoms are currently under excellent control.  He attempted to wean and discontinue pantoprazole however his reflux symptoms returned and he resumed daily use.  No other gastrointestinal complaints.  Current Medications, Allergies, Past Medical History, Past Surgical History, Family History and Social History were reviewed in [employer] record.  Physical Exam: General: Well developed, well nourished, no acute distress Head: Normocephalic and atraumatic Eyes:  sclerae anicteric, EOMI Ears: Normal auditory acuity Mouth: No deformity or lesions Lungs: Clear throughout to auscultation Heart: Regular rate and rhythm; no murmurs, rubs or bruits Abdomen: Soft, non tender and non distended. No masses, hepatosplenomegaly or hernias noted. Normal Bowel sounds Rectal: Not done Musculoskeletal: Symmetrical with no gross deformities  Pulses:  Normal pulses noted Extremities: No clubbing, cyanosis, edema or deformities noted Neurological: Alert oriented x 4, grossly nonfocal Psychological:  Alert and cooperative. Normal mood and affect   Assessment and Recommendations:  1. GERD. Continue pantoprazole 40 mg po qd. Follow antireflux measures. REV in 1 year.  2. CRC screening, average risk. A 10 year interval colonoscopy 06/2024.

## 2019-02-01 DIAGNOSIS — Z1283 Encounter for screening for malignant neoplasm of skin: Secondary | ICD-10-CM | POA: Diagnosis not present

## 2019-02-01 DIAGNOSIS — L570 Actinic keratosis: Secondary | ICD-10-CM | POA: Diagnosis not present

## 2019-02-01 DIAGNOSIS — L821 Other seborrheic keratosis: Secondary | ICD-10-CM | POA: Diagnosis not present

## 2019-02-01 DIAGNOSIS — Z85828 Personal history of other malignant neoplasm of skin: Secondary | ICD-10-CM | POA: Diagnosis not present

## 2019-02-01 DIAGNOSIS — S60511A Abrasion of right hand, initial encounter: Secondary | ICD-10-CM | POA: Diagnosis not present

## 2019-02-01 DIAGNOSIS — L820 Inflamed seborrheic keratosis: Secondary | ICD-10-CM | POA: Diagnosis not present

## 2019-02-17 ENCOUNTER — Encounter: Payer: Self-pay | Admitting: Family Medicine

## 2019-02-17 ENCOUNTER — Other Ambulatory Visit: Payer: Self-pay

## 2019-02-17 ENCOUNTER — Ambulatory Visit (INDEPENDENT_AMBULATORY_CARE_PROVIDER_SITE_OTHER)
Admission: RE | Admit: 2019-02-17 | Discharge: 2019-02-17 | Disposition: A | Discharge status: Home / Self Care | Payer: BC Managed Care – PPO | Source: Ambulatory Visit | Attending: Family Medicine | Admitting: Family Medicine

## 2019-02-17 ENCOUNTER — Ambulatory Visit (INDEPENDENT_AMBULATORY_CARE_PROVIDER_SITE_OTHER): Payer: BC Managed Care – PPO | Admitting: Family Medicine

## 2019-02-17 VITALS — BP 140/80 | HR 69 | Temp 97.9°F | Ht 73.25 in | Wt 254.3 lb

## 2019-02-17 DIAGNOSIS — M25572 Pain in left ankle and joints of left foot: Secondary | ICD-10-CM

## 2019-02-17 MED ORDER — TRAMADOL HCL 50 MG PO TABS: 50.0000 mg | ORAL_TABLET | Freq: Three times a day (TID) | ORAL | 0 refills | Status: AC | PRN

## 2019-02-17 NOTE — Patient Instructions (Signed)
We'll call about ortho appointment.  Nonweight bearing, use crutches, ice as needed.  Tramadol for pain, sedation caution.  Take care.  Glad to see you.

## 2019-02-17 NOTE — Progress Notes (Signed)
He may have twisted his L ankle 3 days ago, was wearing high boots.  Not much pain at the time, but more pain in the interval, progressive.  Achilles not ttp but pain with discomfort with standing lateral to the L achilles.  No pain medial to the Achilles.  No R ankle pain.  Not ttp on the L foot.  No FCNAVD.  It was a little puffy at lateral malleolus yesterday.  No bruising.    Meds, vitals, and allergies reviewed.   ROS: Per HPI unless specifically indicated in ROS section   Sitting in wheelchair  nad  Left Achilles not ttp Med and lat mal not ttp  Midfoot not ttp Normal DP pulse.  Normal cap refill No bruising, not puffy 5th ray not ttp Plantar side of left foot not ttp Pain lateral to achilles after ankle ROM but not during. Pain with weightbearing lateral to Achilles but not on the Achilles.

## 2019-02-18 DIAGNOSIS — M25572 Pain in left ankle and joints of left foot: Secondary | ICD-10-CM | POA: Diagnosis not present

## 2019-02-20 DIAGNOSIS — M25572 Pain in left ankle and joints of left foot: Secondary | ICD-10-CM | POA: Insufficient documentation

## 2019-02-20 NOTE — Assessment & Plan Note (Addendum)
He does not have Achilles pain on exam and he does not have sign of Achilles rupture.  He is still able to plantar flex. X-rays reviewed and discussed with patient.  He did not feel better in CAM.  D/w pt about crutches and NWB in the meantime with Ortho referral pending.  He could have peroneal tendonitis and given that he did not feel better the cam [place] then not weightbearing with crutches is probably the best options.  Routine cautions given to patient.   Discussed using ice and tramadol with sedation caution.  He agrees with plan. >25 minutes spent in face to face time with patient, >50% spent in counselling or coordination of care.

## 2019-06-14 ENCOUNTER — Other Ambulatory Visit: Payer: Self-pay | Admitting: Gastroenterology

## 2019-08-10 ENCOUNTER — Other Ambulatory Visit: Payer: Self-pay | Admitting: Gastroenterology

## 2019-09-01 ENCOUNTER — Other Ambulatory Visit: Payer: Self-pay

## 2019-09-01 ENCOUNTER — Encounter: Payer: Self-pay | Admitting: Family Medicine

## 2019-09-01 ENCOUNTER — Ambulatory Visit: Payer: BC Managed Care – PPO | Admitting: Family Medicine

## 2019-09-01 VITALS — BP 122/84 | HR 88 | Temp 97.1°F | Ht 73.25 in | Wt 264.1 lb

## 2019-09-01 DIAGNOSIS — R319 Hematuria, unspecified: Secondary | ICD-10-CM

## 2019-09-01 DIAGNOSIS — Z029 Encounter for administrative examinations, unspecified: Secondary | ICD-10-CM | POA: Diagnosis not present

## 2019-09-01 LAB — POC URINALSYSI DIPSTICK (AUTOMATED)
Bilirubin, UA: NEGATIVE
Blood, UA: NEGATIVE
Glucose, UA: NEGATIVE
Ketones, UA: NEGATIVE
Leukocytes, UA: NEGATIVE
Nitrite, UA: NEGATIVE
Protein, UA: NEGATIVE
Spec Grav, UA: >=1.03 — AB (ref 1.010–1.025)
Urobilinogen, UA: 0.2 E.U./dL
pH, UA: 5.5 (ref 5.0–8.0)

## 2019-09-01 NOTE — Progress Notes (Signed)
This visit occurred during the SARS-CoV-2 public health emergency.  Safety protocols were in place, including screening questions prior to the visit, additional usage of staff PPE, and extensive cleaning of exam room while observing appropriate contact time as indicated for disinfecting solutions.  DOT exam follow up.    He had BP elevation at time of exam only.  BP wnl today. No CP, SOB, BLE edema, syncope.  Recheck BP 122/84 and 128/88.  He doesn't have HTN.   He had hematuria on dipsick w/o any follow up micro at DOT exam. He didn't have urine sx.  No h/o renal stones.  He had some BLQ pain, but that was with very deep palpation at the DOT exam only and not o/w.  No dysuria.  No FCNAVD.  He quit smoking in 2007.  He was prev intermittent smoker.  No abdominal sx o/w.    Meds, vitals, and allergies reviewed.   ROS: Per HPI unless specifically indicated in ROS section   GEN: nad, alert and oriented HEENT: ncat NECK: supple w/o LA CV: rrr. PULM: ctab, no inc wob ABD: soft, +bs, not ttp.  EXT: no edema SKIN: no acute rash

## 2019-09-01 NOTE — Patient Instructions (Signed)
We'll check your urine and then update you.   Your BP is fine.  Take care.  Glad to see you.

## 2019-09-02 DIAGNOSIS — Z029 Encounter for administrative examinations, unspecified: Secondary | ICD-10-CM | POA: Insufficient documentation

## 2019-09-02 LAB — URINE CULTURE
MICRO NUMBER:: 10317997
Result:: NO GROWTH
SPECIMEN QUALITY:: ADEQUATE

## 2019-09-02 LAB — URINALYSIS, MICROSCOPIC ONLY
Bacteria, UA: NONE SEEN /HPF
Hyaline Cast: NONE SEEN /LPF
Squamous Epithelial / LPF: NONE SEEN /HPF (ref <=5)

## 2019-09-02 NOTE — Assessment & Plan Note (Signed)
No abd pain.  Prev hematuria appears to be a false positive. F/u u/a here neg for blood and micro exam neg.  See lab report.  No abd pain.    BP wnl.  Unremarkable exam.  He doesn't have HTN.   No sign of a problem that would prevent driving, d/w pt.   See scanned forms.

## 2019-09-09 ENCOUNTER — Telehealth: Payer: Self-pay | Admitting: Gastroenterology

## 2019-09-09 MED ORDER — PANTOPRAZOLE SODIUM 40 MG PO TBEC: DELAYED_RELEASE_TABLET | ORAL | 0 refills | Status: DC

## 2019-09-09 NOTE — Telephone Encounter (Signed)
Prescription sent to patient's pharmacy until scheduled appt. 

## 2019-09-15 ENCOUNTER — Other Ambulatory Visit: Payer: Self-pay | Admitting: Gastroenterology

## 2019-09-21 ENCOUNTER — Ambulatory Visit: Payer: BC Managed Care – PPO | Admitting: Gastroenterology

## 2019-09-21 ENCOUNTER — Encounter: Payer: Self-pay | Admitting: Gastroenterology

## 2019-09-21 VITALS — BP 128/86 | HR 76 | Temp 98.7°F | Ht 73.0 in | Wt 264.0 lb

## 2019-09-21 DIAGNOSIS — K219 Gastro-esophageal reflux disease without esophagitis: Secondary | ICD-10-CM

## 2019-09-21 MED ORDER — PANTOPRAZOLE SODIUM 40 MG PO TBEC: DELAYED_RELEASE_TABLET | ORAL | 11 refills | Status: DC

## 2019-09-21 NOTE — Patient Instructions (Signed)
We have sent the following medications to your pharmacy for you to pick up at your convenience: pantoprazole.   Normal BMI (Body Mass Index- based on height and weight) is between 19 and 25. Your BMI today is Body mass index is 34.83 kg/m. Marland Kitchen Please consider follow up  regarding your BMI with your Primary Care Provider.  Thank you for choosing me and [facility].  Pricilla Riffle. Dagoberto Ligas., MD., Marval Regal

## 2019-09-21 NOTE — Progress Notes (Signed)
    History of Present Illness: This is a 54 year old male returning for follow-up of GERD.  His reflux symptoms are well controlled on his current regimen.  He states he tried to discontinue pantoprazole for several days and had a return of reflux symptoms so he resumed treatment.  No other gastrointestinal complaints.  Current Medications, Allergies, Past Medical History, Past Surgical History, Family History and Social History were reviewed in [employer] record.   Physical Exam: General: Well developed, well nourished, no acute distress Head: Normocephalic and atraumatic Eyes:  sclerae anicteric, EOMI Ears: Normal auditory acuity Mouth: Not examined, mask on during Covid-19 pandemic Lungs: Clear throughout to auscultation Heart: Regular rate and rhythm; no murmurs, rubs or bruits Abdomen: Soft, non tender and non distended. No masses, hepatosplenomegaly or hernias noted. Normal Bowel sounds Rectal: Musculoskeletal: Symmetrical with no gross deformities  Pulses:  Normal pulses noted Extremities: No clubbing, cyanosis, edema or deformities noted Neurological: Alert oriented x 4, grossly nonfocal Psychological:  Alert and cooperative. Normal mood and affect   Assessment and Recommendations:  1.  GERD.  Refill pantoprazole 40 mg po qam. Follow antireflux measures. REV in 1 year.   2.  CRC screening average risk.  A 10-year interval screening colonoscopy is recommended in January 2026.

## 2019-09-28 ENCOUNTER — Telehealth: Payer: Self-pay

## 2019-09-28 NOTE — Telephone Encounter (Signed)
Pt said end of march 2021 had DOT exam and microscopic blood seen; pt saw Dr Damita Dunnings 09/01/19 with no blood in urine. 09/28/19 at 11 AM pt urinated and was all bright red blood; pt voided again at 2:30 and 3:30 and urine was red blood. Pt said when urinating it was a steady stream of blood. Pt has no abd or back pain; no pain when urinating, no fever, no dizziness and does not feel like he is going to pass out. Pt has never had anything like this before; pt does not have hx of UTI's, prostate infections or kidney stones. 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 Pt wants to be seen in office today. I explained it is almost 4:30 and we do not have an appt with provider. Pt is concerned but does not want to go to UC or ED because in the past the UC or ED always tells him to FU with his doctor. Pt scheduled in office appt on 09/29/19 at 8 AM with Dr Glori Bickers. UC & ED precautions given and pt voiced understanding. Dr Damita Dunnings is out of office(Dr G was with virtual my chart visit or call) and I spoke with Gentry Fitz NP and was advised schedule appt in AM and if condition changes or worsens go to UC or ED. FYI to Dr Deloris Ping NP and Dr Glori Bickers.

## 2019-09-28 NOTE — Telephone Encounter (Signed)
Aware, I will see him then °

## 2019-09-29 ENCOUNTER — Encounter: Payer: Self-pay | Admitting: Urology

## 2019-09-29 ENCOUNTER — Encounter: Payer: Self-pay | Admitting: Family Medicine

## 2019-09-29 ENCOUNTER — Ambulatory Visit: Payer: BC Managed Care – PPO | Admitting: Urology

## 2019-09-29 ENCOUNTER — Other Ambulatory Visit: Payer: Self-pay

## 2019-09-29 ENCOUNTER — Ambulatory Visit: Payer: BC Managed Care – PPO | Admitting: Family Medicine

## 2019-09-29 VITALS — BP 130/88 | HR 82 | Temp 98.0°F | Ht 73.0 in | Wt 266.2 lb

## 2019-09-29 VITALS — BP 139/86 | HR 80 | Ht 73.0 in | Wt 266.0 lb

## 2019-09-29 DIAGNOSIS — R310 Gross hematuria: Secondary | ICD-10-CM

## 2019-09-29 DIAGNOSIS — R319 Hematuria, unspecified: Secondary | ICD-10-CM

## 2019-09-29 LAB — CBC WITH DIFFERENTIAL/PLATELET
Basophils Absolute: 0 10*3/uL (ref 0.0–0.1)
Basophils Relative: 0.6 % (ref 0.0–3.0)
Eosinophils Absolute: 0.2 10*3/uL (ref 0.0–0.7)
Eosinophils Relative: 3 % (ref 0.0–5.0)
HCT: 46.1 % (ref 39.0–52.0)
Hemoglobin: 15.3 g/dL (ref 13.0–17.0)
Lymphocytes Relative: 38.3 % (ref 12.0–46.0)
Lymphs Abs: 2.7 10*3/uL (ref 0.7–4.0)
MCHC: 33.3 g/dL (ref 30.0–36.0)
MCV: 86.7 fl (ref 78.0–100.0)
Monocytes Absolute: 0.6 10*3/uL (ref 0.1–1.0)
Monocytes Relative: 8.1 % (ref 3.0–12.0)
Neutro Abs: 3.5 10*3/uL (ref 1.4–7.7)
Neutrophils Relative %: 50 % (ref 43.0–77.0)
Platelets: 210 10*3/uL (ref 150.0–400.0)
RBC: 5.31 Mil/uL (ref 4.22–5.81)
RDW: 13.8 % (ref 11.5–15.5)
WBC: 6.9 10*3/uL (ref 4.0–10.5)

## 2019-09-29 LAB — RENAL FUNCTION PANEL
Albumin: 4.2 g/dL (ref 3.5–5.2)
BUN: 13 mg/dL (ref 6–23)
CO2: 30 mEq/L (ref 19–32)
Calcium: 9.4 mg/dL (ref 8.4–10.5)
Chloride: 103 mEq/L (ref 96–112)
Creatinine, Ser: 1.28 mg/dL (ref 0.40–1.50)
GFR: 58.22 mL/min — ABNORMAL LOW (ref >60.00)
Glucose, Bld: 108 mg/dL — ABNORMAL HIGH (ref 70–99)
Phosphorus: 2.9 mg/dL (ref 2.3–4.6)
Potassium: 4.9 mEq/L (ref 3.5–5.1)
Sodium: 138 mEq/L (ref 135–145)

## 2019-09-29 LAB — POC URINALSYSI DIPSTICK (AUTOMATED)
Bilirubin, UA: NEGATIVE
Blood, UA: NEGATIVE
Glucose, UA: NEGATIVE
Ketones, UA: NEGATIVE
Leukocytes, UA: NEGATIVE
Nitrite, UA: NEGATIVE
Protein, UA: NEGATIVE
Spec Grav, UA: >=1.03 — AB (ref 1.010–1.025)
Urobilinogen, UA: 0.2 E.U./dL
pH, UA: 6 (ref 5.0–8.0)
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
## 2019-09-29 NOTE — Telephone Encounter (Signed)
Noted. Thanks.
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
## 2019-09-29 NOTE — Patient Instructions (Addendum)
Avoid aspirin and ibuprofen and naproxen   Labs for blood count and also kidney profile Also will culture urine   Drink lots and lots of water  Avoid dehydration   I will place an order for urology referral -the office will call you   Stay home today and drink lots of water    If symptoms return in the meantime please let us know

## 2019-09-29 NOTE — Assessment & Plan Note (Signed)
Gross hematuria now resolved  No other symptoms  Cbc and renal panel today  u cx pending (ua is clear but concentrated) Enc inc water intake  Also avoidance of asa and nsaids Call if this re occurs  Ref made to urology for further eval

## 2019-09-29 NOTE — Patient Instructions (Signed)

## 2019-09-30 LAB — URINE CULTURE
MICRO NUMBER:: 10421034
Result:: NO GROWTH
SPECIMEN QUALITY:: ADEQUATE

## 2019-10-03 LAB — URINALYSIS, COMPLETE
Bilirubin, UA: NEGATIVE
Glucose, UA: NEGATIVE
Ketones, UA: NEGATIVE
Leukocytes,UA: NEGATIVE
Nitrite, UA: NEGATIVE
Protein,UA: NEGATIVE
RBC, UA: NEGATIVE
Specific Gravity, UA: >1.03 — ABNORMAL HIGH (ref 1.005–1.030)
Urobilinogen, Ur: 0.2 mg/dL (ref 0.2–1.0)
pH, UA: 5.5 (ref 5.0–7.5)

## 2019-10-03 LAB — MICROSCOPIC EXAMINATION
Bacteria, UA: NONE SEEN
RBC, Urine: NONE SEEN /hpf (ref 0–2)

## 2019-10-12 ENCOUNTER — Other Ambulatory Visit: Payer: Self-pay | Admitting: Urology

## 2019-10-12 DIAGNOSIS — R310 Gross hematuria: Secondary | ICD-10-CM

## 2019-10-13 ENCOUNTER — Other Ambulatory Visit: Payer: Self-pay | Admitting: Gastroenterology

## 2019-10-17 ENCOUNTER — Ambulatory Visit
Admission: RE | Admit: 2019-10-17 | Discharge: 2019-10-17 | Disposition: A | Discharge status: Home / Self Care | Payer: BC Managed Care – PPO | Source: Ambulatory Visit | Attending: Urology | Admitting: Urology

## 2019-10-17 ENCOUNTER — Other Ambulatory Visit: Payer: Self-pay

## 2019-10-17 DIAGNOSIS — R310 Gross hematuria: Secondary | ICD-10-CM | POA: Insufficient documentation

## 2019-10-17 DIAGNOSIS — N201 Calculus of ureter: Secondary | ICD-10-CM | POA: Diagnosis not present

## 2019-10-17 MED ORDER — IOHEXOL 300 MG/ML  SOLN
125.0000 mL | Freq: Once | INTRAMUSCULAR | Status: AC | PRN
  Administered 2019-10-17: 125 mL via INTRAVENOUS

## 2019-10-19 ENCOUNTER — Encounter: Payer: Self-pay | Admitting: Urology

## 2019-10-19 ENCOUNTER — Other Ambulatory Visit: Payer: Self-pay

## 2019-10-19 ENCOUNTER — Ambulatory Visit: Payer: BC Managed Care – PPO | Admitting: Urology

## 2019-10-19 ENCOUNTER — Telehealth: Payer: Self-pay | Admitting: Family Medicine

## 2019-10-19 VITALS — BP 138/82 | HR 79 | Ht 73.0 in | Wt 266.0 lb

## 2019-10-19 DIAGNOSIS — Z125 Encounter for screening for malignant neoplasm of prostate: Secondary | ICD-10-CM

## 2019-10-19 DIAGNOSIS — Z01818 Encounter for other preprocedural examination: Secondary | ICD-10-CM

## 2019-10-19 DIAGNOSIS — N201 Calculus of ureter: Secondary | ICD-10-CM | POA: Diagnosis not present

## 2019-10-19 MED ORDER — TAMSULOSIN HCL 0.4 MG PO CAPS: 0.4000 mg | ORAL_CAPSULE | Freq: Every day | ORAL | 0 refills | Status: DC

## 2019-10-19 NOTE — Telephone Encounter (Signed)
Notify pt.  I saw his CT report re: borderline enlarged nymph node.   3 month follow-up may be helpful to assess for stability. I put a note in the EMR about his.  I'll await his f/u notes from urology.  Thanks.

## 2019-10-19 NOTE — Patient Instructions (Signed)
Laser Therapy for Kidney Stones Laser therapy for kidney stones is a procedure to break up small, hard mineral deposits that form in the kidney (kidney stones). The procedure is done using a device that produces a focused beam of light (laser). The laser breaks up kidney stones into pieces that are small enough to be passed out of the body through urination or removed from the body during the procedure. You may need laser therapy if you have kidney stones that are painful or block your urinary tract. This procedure is done by inserting a tube (ureteroscope) into your kidney through the urethral opening. The urethra is the part of the body that drains urine from the bladder. In women, the urethra opens above the vaginal opening. In men, the urethra opens at the tip of the penis. The ureteroscope is inserted through the urethra, and surgical instruments are moved through the bladder and the muscular tube that connects the kidney to the bladder (ureter) until they reach the kidney. Tell a health care provider about:  Any allergies you have.  All medicines you are taking, including vitamins, herbs, eye drops, creams, and over-the-counter medicines.  Any problems you or family members have had with anesthetic medicines.  Any blood disorders you have.  Any surgeries you have had.  Any medical conditions you have.  Whether you are pregnant or may be pregnant. What are the risks? Generally, this is a safe procedure. However, problems may occur, including:  Infection.  Bleeding.  Allergic reactions to medicines.  Damage to the urethra, bladder, or ureter.  Urinary tract infection (UTI).  Narrowing of the urethra (urethral stricture).  Difficulty passing urine.  Blockage of the kidney caused by a fragment of kidney stone. What happens before the procedure? Medicines  Ask your health care provider about: ? Changing or stopping your regular medicines. This is especially important if you  are taking diabetes medicines or blood thinners. ? Taking medicines such as aspirin and ibuprofen. These medicines can thin your blood. Do not take these medicines unless your health care provider tells you to take them. ? Taking over-the-counter medicines, vitamins, herbs, and supplements. Eating and drinking Follow instructions from your health care provider about eating and drinking, which may include:  8 hours before the procedure - stop eating heavy meals or foods, such as meat, fried foods, or fatty foods.  6 hours before the procedure - stop eating light meals or foods, such as toast or cereal.  6 hours before the procedure - stop drinking milk or drinks that contain milk.  2 hours before the procedure - stop drinking clear liquids. Staying hydrated Follow instructions from your health care provider about hydration, which may include:  Up to 2 hours before the procedure - you may continue to drink clear liquids, such as water, clear fruit juice, black coffee, and plain tea.  General instructions  You may have a physical exam before the procedure. You may also have tests, such as imaging tests and blood or urine tests.  If your ureter is too narrow, your health care provider may place a soft, flexible tube (stent) inside of it. The stent may be placed days or weeks before your laser therapy procedure.  Plan to have someone take you home from the hospital or clinic.  If you will be going home right after the procedure, plan to have someone stay with you for 24 hours.  Do not use any products that contain nicotine or tobacco for at least 4  weeks before the procedure. These products include cigarettes, e-cigarettes, and chewing tobacco. If you need help quitting, ask your health care provider.  Ask your health care provider: ? How your surgical site will be marked or identified. ? What steps will be taken to help prevent infection. These may include:  Removing hair at the surgery  site.  Washing skin with a germ-killing soap.  Taking antibiotic medicine. What happens during the procedure?   An IV will be inserted into one of your veins.  You will be given one or more of the following: ? A medicine to help you relax (sedative). ? A medicine to numb the area (local anesthetic). ? A medicine to make you fall asleep (general anesthetic).  A ureteroscope will be inserted into your urethra. The ureteroscope will send images to a video screen in the operating room to guide your surgeon to the area of your kidney that will be treated.  A small, flexible tube will be threaded through the ureteroscope and into your bladder and ureter, up to your kidney.  The laser device will be inserted into your kidney through the tube. Your surgeon will pulse the laser on and off to break up kidney stones.  A surgical instrument that has a tiny wire basket may be inserted through the tube into your kidney to remove the pieces of broken kidney stone. The procedure may vary among health care providers and hospitals. What happens after the procedure?  Your blood pressure, heart rate, breathing rate, and blood oxygen level will be monitored until you leave the hospital or clinic.  You will be given pain medicine as needed.  You may continue to receive antibiotics.  You may have a stent temporarily placed in your ureter.  Do not drive for 24 hours if you were given a sedative during your procedure.  You may be given a strainer to collect any stone fragments that you pass in your urine. Your health care provider may have these tested. Summary  Laser therapy for kidney stones is a procedure to break up kidney stones into pieces that are small enough to be passed out of the body through urination or removed during the procedure.  Follow instructions from your health care provider about eating and drinking before the procedure.  During the procedure, the ureteroscope will send images  to a video screen to guide your surgeon to the area of your kidney that will be treated.  Do not drive for 24 hours if you were given a sedative during your procedure. This information is not intended to replace advice given to you by your health care provider. Make sure you discuss any questions you have with your health care provider. Document Revised: 01/28/2018 Document Reviewed: 01/28/2018 Elsevier Patient Education  [place].   Ureteral Stent Implantation  Ureteral stent implantation is a procedure to insert (implant) a flexible, soft, plastic tube (stent) into a ureter. Ureters are the tube-like parts of the body that drain urine from the kidneys. The stent supports the ureter while it heals and helps to drain urine. You may have a ureteral stent implanted after having a procedure to remove a blockage from the ureter (ureterolysis or pyeloplasty). You may also have a stent implanted to open the flow of urine when you have a blockage caused by a kidney stone, tumor, blood clot, or infection. You have two ureters, one on each side of the body. The ureters connect the kidneys to the organ that holds urine  until it passes out of the body (bladder). The stent is placed so that one end is in the kidney, and one end is in the bladder. The stent is usually taken out after your ureter has healed. Depending on your condition, you may have a stent for just a few weeks, or you may have a long-term stent that will need to be replaced every few months. Tell a health care provider about:  Any allergies you have.  All medicines you are taking, including vitamins, herbs, eye drops, creams, and over-the-counter medicines.  Any problems you or family members have had with anesthetic medicines.  Any blood disorders you have.  Any surgeries you have had.  Any medical conditions you have.  Whether you are pregnant or may be pregnant. What are the risks? Generally, this is a safe procedure.  However, problems may occur, including:  Infection.  Bleeding.  Allergic reactions to medicines.  Damage to other structures or organs. Tearing (perforation) of the ureter is possible.  Movement of the stent away from where it is placed during surgery (migration). What happens before the procedure? Medicines Ask your health care provider about:  Changing or stopping your regular medicines. This is especially important if you are taking diabetes medicines or blood thinners.  Taking medicines such as aspirin and ibuprofen. These medicines can thin your blood. Do not take these medicines unless your health care provider tells you to take them.  Taking over-the-counter medicines, vitamins, herbs, and supplements. Eating and drinking Follow instructions from your health care provider about eating and drinking, which may include:  8 hours before the procedure - stop eating heavy meals or foods, such as meat, fried foods, or fatty foods.  6 hours before the procedure - stop eating light meals or foods, such as toast or cereal.  6 hours before the procedure - stop drinking milk or drinks that contain milk.  2 hours before the procedure - stop drinking clear liquids. Staying hydrated Follow instructions from your health care provider about hydration, which may include:  Up to 2 hours before the procedure - you may continue to drink clear liquids, such as water, clear fruit juice, black coffee, and plain tea. General instructions  Do not drink alcohol.  Do not use any products that contain nicotine or tobacco for at least 4 weeks before the procedure. These products include cigarettes, e-cigarettes, and chewing tobacco. If you need help quitting, ask your health care provider.  You may have an exam or testing, such as imaging or blood tests.  Ask your health care provider what steps will be taken to help prevent infection. These may include: ? Removing hair at the surgery  site. ? Washing skin with a germ-killing soap. ? Taking antibiotic medicine.  Plan to have someone take you home from the hospital or clinic.  If you will be going home right after the procedure, plan to have someone with you for 24 hours. What happens during the procedure?  An IV will be inserted into one of your veins.  You may be given a medicine to help you relax (sedative).  You may be given a medicine to make you fall asleep (general anesthetic).  A thin, tube-shaped instrument with a light and tiny camera at the end (cystoscope) will be inserted into your urethra. The urethra is the tube that drains urine from the bladder out of the body. In men, the urethra opens at the end of the penis. In women, the urethra opens   in front of the vaginal opening.  The cystoscope will be passed into your bladder.  A thin wire (guide wire) will be passed through your bladder and into your ureter. This is used to guide the stent into your ureter.  The stent will be inserted into your ureter.  The guide wire and the cystoscope will be removed.  A flexible tube (catheter) may be inserted through your urethra so that one end is in your bladder. This helps to drain urine from your bladder. The procedure may vary among hospitals and health care providers. What happens after the procedure?  Your blood pressure, heart rate, breathing rate, and blood oxygen level will be monitored until you leave the hospital or clinic.  You may continue to receive medicine and fluids through an IV.  You may have some soreness or pain in your abdomen and urethra. Medicines will be available to help you.  You will be encouraged to get up and walk around as soon as you can.  You may have a catheter draining your urine.  You will have some blood in your urine.  Do not drive for 24 hours if you were given a sedative during your procedure. Summary  Ureteral stent implantation is a procedure to insert a flexible,  soft, plastic tube (stent) into a ureter.  You may have a stent implanted to support the ureter while it heals after a procedure or to open the flow of urine if there is a blockage.  Follow instructions from your health care provider about taking medicines and about eating and drinking before the procedure.  Depending on your condition, you may have a stent for just a few weeks, or you may have a long-term stent that will need to be replaced every few months. This information is not intended to replace advice given to you by your health care provider. Make sure you discuss any questions you have with your health care provider. Document Revised: 02/23/2018 Document Reviewed: 02/24/2018 Elsevier Patient Education  2020 [employer].   Prostate Cancer Screening  Prostate cancer screening is a test that is done to check for the presence of prostate cancer in men. The prostate gland is a walnut-sized gland that is located below the bladder and in front of the rectum in males. The function of the prostate is to add fluid to semen during ejaculation. Prostate cancer is the second most common type of cancer in men. Who should have prostate cancer screening?  Screening recommendations vary based on age and other risk factors. Screening is recommended if:  You are older than age 68. If you are age 9-69, talk with your health care provider about your need for screening and how often screening should be done. Because most prostate cancers are slow growing and will not cause death, screening is generally reserved in this age group for men who have a 10-15-year life expectancy.  You are younger than age 104, and you have these risk factors: ? Being a black male or a male of African descent. ? Having a father, brother, or uncle who has been diagnosed with prostate cancer. The risk is higher if your family member's cancer occurred at an early age. Screening is not recommended if:  You are younger than age  13.  You are between the ages of 79 and 78 and you have no risk factors.  You are 28 years of age or older. At this age, the risks that screening can cause are greater than the  benefits that it may provide. If you are at high risk for prostate cancer, your health care provider may recommend that you have screenings more often or that you start screening at a younger age. How is screening for prostate cancer done? The recommended prostate cancer screening test is a blood test called the prostate-specific antigen (PSA) test. PSA is a protein that is made in the prostate. As you age, your prostate naturally produces more PSA. Abnormally high PSA levels may be caused by:  Prostate cancer.  An enlarged prostate that is not caused by cancer (benign prostatic hyperplasia, BPH). This condition is very common in older men.  A prostate gland infection (prostatitis). Depending on the PSA results, you may need more tests, such as:  A physical exam to check the size of your prostate gland.  Blood and imaging tests.  A procedure to remove tissue samples from your prostate gland for testing (biopsy). What are the benefits of prostate cancer screening?  Screening can help to identify cancer at an early stage, before symptoms start and when the cancer can be treated more easily.  There is a small chance that screening may lower your risk of dying from prostate cancer. The chance is small because prostate cancer is a slow-growing cancer, and most men with prostate cancer die from a different cause. What are the risks of prostate cancer screening? The main risk of prostate cancer screening is diagnosing and treating prostate cancer that would never have caused any symptoms or problems. This is called overdiagnosisand overtreatment. PSA screening cannot tell you if your PSA is high due to cancer or a different cause. A prostate biopsy is the only procedure to diagnose prostate cancer. Even the results of a  biopsy may not tell you if your cancer needs to be treated. Slow-growing prostate cancer may not need any treatment other than monitoring, so diagnosing and treating it may cause unnecessary stress or other side effects. A prostate biopsy may also cause:  Infection or fever.  A false negative. This is a result that shows that you do not have prostate cancer when you actually do have prostate cancer. Questions to ask your health care provider  When should I start prostate cancer screening?  What is my risk for prostate cancer?  How often do I need screening?  What type of screening tests do I need?  How do I get my test results?  What do my results mean?  Do I need treatment? Where to find more information  The American Cancer Society: www.cancer.org  American Urological Association: www.auanet.org Contact a health care provider if:  You have difficulty urinating.  You have pain when you urinate or ejaculate.  You have blood in your urine or semen.  You have pain in your back or in the area of your prostate. Summary  Prostate cancer is a common type of cancer in men. The prostate gland is located below the bladder and in front of the rectum. This gland adds fluid to semen during ejaculation.  Prostate cancer screening may identify cancer at an early stage, when the cancer can be treated more easily.  The prostate-specific antigen (PSA) test is the recommended screening test for prostate cancer.  Discuss the risks and benefits of prostate cancer screening with your health care provider. If you are age 41 or older, the risks that screening can cause are greater than the benefits that it may provide. This information is not intended to replace advice given to you  by your health care provider. Make sure you discuss any questions you have with your health care provider. Document Revised: 12/30/2018 Document Reviewed: 12/30/2018 Elsevier Patient Education  [place].

## 2019-10-19 NOTE — Progress Notes (Signed)
   10/19/2019 9:18 AM   Rolm Gala 1965/11/27 [id number]  Reason for visit: Follow up gross hematuria, review CT results  HPI: I saw Mr. Hartsell back in urology clinic to review his CT results.  He was originally scheduled for cystoscopy today to complete hematuria work-up, however his CT showed a 7 mm left distal ureteral stone with no hydronephrosis.  He continues to deny any symptoms of abdominal or flank pain.  I reviewed the CT with the patient that shows a 7 mm left distal ureteral stone with no significant hydronephrosis, 1 cm right pelvic lymph node, and heterogenous prostate.   We discussed treatment options for his stone including medical expulsive therapy, ureteroscopy, shockwave lithotripsy.  We discussed the risk and benefits at length.  He would like to pursue medical expulsive therapy for another 2 weeks as he is completely asymptomatic, then pursue ureteroscopy and laser lithotripsy if stone is still present at that time.  We specifically discussed the risks ureteroscopy including bleeding, infection/sepsis, stent related symptoms including flank pain/urgency/frequency/incontinence/dysuria, ureteral injury, inability to access stone, or need for staged or additional procedures.  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  DRE 50 g, smooth, no nodules or masses, limited by body habitus.  On exam there are no obvious skin lesions at the prior area of squamous cell carcinoma resection.  -Trial of medical expulsive therapy with Flomax for asymptomatic 7 mm left ureteral stone -Schedule left ureteroscopy in 2 to 3 weeks if stone has not passed, KUB prior to surgery -PSA today, call with results -We will forward CT findings to PCP and dermatology  Billey Co, [place] [address] [place], [place] [address] [phone]

## 2019-10-20 ENCOUNTER — Telehealth: Payer: Self-pay

## 2019-10-20 ENCOUNTER — Other Ambulatory Visit: Payer: Self-pay | Admitting: Urology

## 2019-10-20 DIAGNOSIS — N201 Calculus of ureter: Secondary | ICD-10-CM

## 2019-10-20 LAB — URINALYSIS, COMPLETE
Bilirubin, UA: NEGATIVE
Glucose, UA: NEGATIVE
Ketones, UA: NEGATIVE
Leukocytes,UA: NEGATIVE
Nitrite, UA: NEGATIVE
Protein,UA: NEGATIVE
RBC, UA: NEGATIVE
Specific Gravity, UA: 1.02 (ref 1.005–1.030)
Urobilinogen, Ur: 1 mg/dL (ref 0.2–1.0)
pH, UA: 7.5 (ref 5.0–7.5)

## 2019-10-20 LAB — MICROSCOPIC EXAMINATION
Bacteria, UA: NONE SEEN
Epithelial Cells (non renal): NONE SEEN /hpf (ref 0–10)
RBC, Urine: NONE SEEN /hpf (ref 0–2)

## 2019-10-20 LAB — PSA TOTAL (REFLEX TO FREE): Prostate Specific Ag, Serum: 2 ng/mL (ref 0.0–4.0)

## 2019-10-20 NOTE — Telephone Encounter (Signed)
See my chart encounter.

## 2019-10-20 NOTE — Telephone Encounter (Signed)
Patient advised.

## 2019-10-20 NOTE — Telephone Encounter (Signed)
-----   Message from Billey Co, MD sent at 10/20/2019  8:13 AM EDT ----- Good news, PSA is normal, keep follow up as scheduled re: kidney stone, thanks  Nickolas Madrid, MD 10/20/2019

## 2019-10-24 ENCOUNTER — Telehealth: Payer: Self-pay

## 2019-10-24 LAB — CULTURE, URINE COMPREHENSIVE

## 2019-10-24 NOTE — Telephone Encounter (Signed)
See my chart encounter.

## 2019-10-24 NOTE — Telephone Encounter (Signed)
-----   Message from Billey Co, MD sent at 10/23/2019 11:38 AM EDT ----- Urine grew very small amount of bacteria, suspect this is just a contaminant. But please make sure he goes to ED or calls ous if having any fever over 101.3 or new symptoms, thanks  Nickolas Madrid, MD 10/23/2019

## 2019-11-02 ENCOUNTER — Encounter
Admission: RE | Admit: 2019-11-02 | Discharge: 2019-11-02 | Disposition: A | Discharge status: Home / Self Care | Payer: BC Managed Care – PPO | Source: Ambulatory Visit | Attending: Urology | Admitting: Urology

## 2019-11-02 ENCOUNTER — Other Ambulatory Visit: Payer: Self-pay

## 2019-11-02 NOTE — Patient Instructions (Signed)
Your procedure is scheduled on: Friday 11/11/19.  Report to DAY SURGERY DEPARTMENT LOCATED ON 2ND FLOOR MEDICAL MALL ENTRANCE. To find out your arrival time please call [phone] between 1PM - 3PM on Thursday 11/10/19.   Remember: Instructions that are not followed completely may result in serious medical risk, up to and including death, or upon the discretion of your surgeon and anesthesiologist your surgery may need to be rescheduled.      __X__ 1. Do not eat food after midnight the night before your procedure.                 No gum chewing or hard candies. You may drink clear liquids up to 2 hours                 before you are scheduled to arrive for your surgery- DO NOT drink clear                 liquids within 2 hours of the start of your surgery.                 Clear Liquids include:  water, apple juice without pulp, clear carbohydrate                 drink such as Clearfast or Gatorade, Black Coffee or Tea (Do not add                 anything to coffee or tea).    __X__2.  On the morning of surgery brush your teeth with toothpaste and water, you may rinse your mouth with mouthwash if you wish.  Do not swallow any toothpaste or mouthwash.      __X__ 3.  No Alcohol for 24 hours before or after surgery.    __X__ 4.  Do Not Smoke or use e-cigarettes For 24 Hours Prior to Your Surgery.                 Do not use any chewable tobacco products for at least 6 hours prior to                 Surgery.   __X__5.  Notify your doctor if there is any change in your medical condition      (cold, fever, infections).       Do not wear jewelry, make-up, hairpins, clips or nail polish. Do not wear lotions, powders, or perfumes.  Do not shave 48 hours prior to surgery. Men may shave face and neck. Do not bring valuables to the hospital.      [facility] is not responsible for any belongings or valuables.   Contacts, dentures/partials or body piercings may not be worn into  surgery. Bring a case for your contacts, glasses or hearing aids, a denture cup will be supplied.    Patients discharged the day of surgery will not be allowed to drive home.     __X__ Take these medicines the morning of surgery with A SIP OF WATER:     1. pantoprazole (PROTONIX)       __X__ Stop Anti-inflammatories 7 days before surgery such as Advil, Ibuprofen, Motrin, BC or Goodies Powder, Naprosyn, Naproxen, Aleve, Aspirin, Meloxicam. May take Tylenol if needed for pain or discomfort.     __X__ Don't start taking any new herbal supplements or vitamins prior to your procedure.

## 2019-11-09 ENCOUNTER — Other Ambulatory Visit: Payer: Self-pay

## 2019-11-09 ENCOUNTER — Other Ambulatory Visit
Admission: RE | Admit: 2019-11-09 | Discharge: 2019-11-09 | Disposition: A | Discharge status: Home / Self Care | Payer: BC Managed Care – PPO | Source: Ambulatory Visit | Attending: Urology | Admitting: Urology

## 2019-11-09 DIAGNOSIS — Z20822 Contact with and (suspected) exposure to covid-19: Secondary | ICD-10-CM | POA: Diagnosis not present

## 2019-11-09 DIAGNOSIS — Z01812 Encounter for preprocedural laboratory examination: Secondary | ICD-10-CM | POA: Insufficient documentation

## 2019-11-09 LAB — SARS CORONAVIRUS 2 (TAT 6-24 HRS): SARS Coronavirus 2: NEGATIVE

## 2019-11-11 ENCOUNTER — Ambulatory Visit: Payer: BC Managed Care – PPO

## 2019-11-11 ENCOUNTER — Encounter: Payer: Self-pay | Admitting: Urology

## 2019-11-11 ENCOUNTER — Other Ambulatory Visit: Payer: Self-pay

## 2019-11-11 ENCOUNTER — Ambulatory Visit: Payer: BC Managed Care – PPO | Admitting: Certified Registered Nurse Anesthetist

## 2019-11-11 ENCOUNTER — Ambulatory Visit
Admission: RE | Admit: 2019-11-11 | Discharge: 2019-11-11 | Disposition: A | Discharge status: Home / Self Care | Payer: BC Managed Care – PPO | Attending: Urology | Admitting: Urology

## 2019-11-11 ENCOUNTER — Encounter
Admission: RE | Disposition: A | Discharge status: Home / Self Care | Payer: Self-pay | Source: Home / Self Care | Attending: Urology

## 2019-11-11 DIAGNOSIS — Z85828 Personal history of other malignant neoplasm of skin: Secondary | ICD-10-CM | POA: Diagnosis not present

## 2019-11-11 DIAGNOSIS — N201 Calculus of ureter: Secondary | ICD-10-CM | POA: Diagnosis not present

## 2019-11-11 DIAGNOSIS — Z01818 Encounter for other preprocedural examination: Secondary | ICD-10-CM | POA: Diagnosis not present

## 2019-11-11 DIAGNOSIS — Z87891 Personal history of nicotine dependence: Secondary | ICD-10-CM | POA: Insufficient documentation

## 2019-11-11 HISTORY — PX: CYSTOSCOPY/URETEROSCOPY/HOLMIUM LASER/STENT PLACEMENT: SHX6546

## 2019-11-11 HISTORY — PX: CYSTOSCOPY W/ RETROGRADES: SHX1426

## 2019-11-11 SURGERY — CYSTOSCOPY/URETEROSCOPY/HOLMIUM LASER/STENT PLACEMENT
Anesthesia: General | Site: Ureter | Laterality: Left

## 2019-11-11 MED ORDER — SUCCINYLCHOLINE CHLORIDE 20 MG/ML IJ SOLN
INTRAMUSCULAR | Status: DC | PRN
  Administered 2019-11-11: 120 mg via INTRAVENOUS

## 2019-11-11 MED ORDER — FENTANYL CITRATE (PF) 100 MCG/2ML IJ SOLN: 25.0000 ug | INTRAMUSCULAR | Status: DC | PRN

## 2019-11-11 MED ORDER — EPHEDRINE SULFATE 50 MG/ML IJ SOLN
INTRAMUSCULAR | Status: DC | PRN
  Administered 2019-11-11: 7.5 mg via INTRAVENOUS
  Administered 2019-11-11: 10 mg via INTRAVENOUS

## 2019-11-11 MED ORDER — LACTATED RINGERS IV SOLN: INTRAVENOUS | Status: DC

## 2019-11-11 MED ORDER — CHLORHEXIDINE GLUCONATE 0.12 % MT SOLN
OROMUCOSAL | Status: AC
  Administered 2019-11-11: 15 mL via OROMUCOSAL
  Filled 2019-11-11: qty 15

## 2019-11-11 MED ORDER — OXYBUTYNIN CHLORIDE ER 10 MG PO TB24
10.0000 mg | ORAL_TABLET | Freq: Every day | ORAL | 0 refills | Status: AC | PRN
Start: 2019-11-11 — End: 2019-11-21

## 2019-11-11 MED ORDER — LIDOCAINE HCL (CARDIAC) PF 100 MG/5ML IV SOSY
PREFILLED_SYRINGE | INTRAVENOUS | Status: DC | PRN
  Administered 2019-11-11 (×2): 100 mg via INTRAVENOUS

## 2019-11-11 MED ORDER — HYDROCODONE-ACETAMINOPHEN 5-325 MG PO TABS: 1.0000 | ORAL_TABLET | ORAL | 0 refills | Status: AC | PRN

## 2019-11-11 MED ORDER — BELLADONNA ALKALOIDS-OPIUM 16.2-60 MG RE SUPP
RECTAL | Status: AC
  Filled 2019-11-11: qty 1

## 2019-11-11 MED ORDER — MIDAZOLAM HCL 2 MG/2ML IJ SOLN
INTRAMUSCULAR | Status: DC | PRN
  Administered 2019-11-11: 2 mg via INTRAVENOUS

## 2019-11-11 MED ORDER — SUGAMMADEX SODIUM 200 MG/2ML IV SOLN
INTRAVENOUS | Status: DC | PRN
  Administered 2019-11-11: 480.8 mg via INTRAVENOUS

## 2019-11-11 MED ORDER — BELLADONNA ALKALOIDS-OPIUM 16.2-60 MG RE SUPP
RECTAL | Status: DC | PRN
  Administered 2019-11-11: 1 via RECTAL

## 2019-11-11 MED ORDER — ONDANSETRON HCL 4 MG/2ML IJ SOLN
INTRAMUSCULAR | Status: DC | PRN
  Administered 2019-11-11: 4 mg via INTRAVENOUS

## 2019-11-11 MED ORDER — ROCURONIUM BROMIDE 100 MG/10ML IV SOLN
INTRAVENOUS | Status: DC | PRN
  Administered 2019-11-11: 30 mg via INTRAVENOUS
  Administered 2019-11-11: 10 mg via INTRAVENOUS
  Administered 2019-11-11: 5 mg via INTRAVENOUS
  Administered 2019-11-11: 10 mg via INTRAVENOUS

## 2019-11-11 MED ORDER — MIDAZOLAM HCL 2 MG/2ML IJ SOLN
INTRAMUSCULAR | Status: AC
  Filled 2019-11-11: qty 2

## 2019-11-11 MED ORDER — CEFAZOLIN SODIUM-DEXTROSE 2-4 GM/100ML-% IV SOLN
2.0000 g | INTRAVENOUS | Status: AC
  Administered 2019-11-11: 2 g via INTRAVENOUS

## 2019-11-11 MED ORDER — IOHEXOL 180 MG/ML  SOLN
INTRAMUSCULAR | Status: DC | PRN
  Administered 2019-11-11: 20 mL

## 2019-11-11 MED ORDER — CEFAZOLIN SODIUM-DEXTROSE 2-4 GM/100ML-% IV SOLN
INTRAVENOUS | Status: AC
  Filled 2019-11-11: qty 100

## 2019-11-11 MED ORDER — DEXAMETHASONE SODIUM PHOSPHATE 10 MG/ML IJ SOLN
INTRAMUSCULAR | Status: DC | PRN
  Administered 2019-11-11: 10 mg via INTRAVENOUS

## 2019-11-11 MED ORDER — SUGAMMADEX SODIUM 500 MG/5ML IV SOLN
INTRAVENOUS | Status: AC
  Filled 2019-11-11: qty 5

## 2019-11-11 MED ORDER — ONDANSETRON HCL 4 MG/2ML IJ SOLN: 4.0000 mg | Freq: Once | INTRAMUSCULAR | Status: DC | PRN

## 2019-11-11 MED ORDER — FENTANYL CITRATE (PF) 100 MCG/2ML IJ SOLN
INTRAMUSCULAR | Status: DC | PRN
  Administered 2019-11-11: 100 ug via INTRAVENOUS

## 2019-11-11 MED ORDER — ORAL CARE MOUTH RINSE: 15.0000 mL | Freq: Once | OROMUCOSAL | Status: AC

## 2019-11-11 MED ORDER — PROPOFOL 10 MG/ML IV BOLUS
INTRAVENOUS | Status: DC | PRN
  Administered 2019-11-11: 200 mg via INTRAVENOUS

## 2019-11-11 MED ORDER — FENTANYL CITRATE (PF) 100 MCG/2ML IJ SOLN
INTRAMUSCULAR | Status: AC
  Filled 2019-11-11: qty 2

## 2019-11-11 MED ORDER — PHENYLEPHRINE HCL (PRESSORS) 10 MG/ML IV SOLN
INTRAVENOUS | Status: DC | PRN
  Administered 2019-11-11: 250 ug via INTRAVENOUS
  Administered 2019-11-11 (×2): 150 ug via INTRAVENOUS
  Administered 2019-11-11: 100 ug via INTRAVENOUS
  Administered 2019-11-11: 200 ug via INTRAVENOUS
  Administered 2019-11-11: 100 ug via INTRAVENOUS

## 2019-11-11 MED ORDER — CHLORHEXIDINE GLUCONATE 0.12 % MT SOLN: 15.0000 mL | Freq: Once | OROMUCOSAL | Status: AC

## 2019-11-11 MED ORDER — KETOROLAC TROMETHAMINE 30 MG/ML IJ SOLN
INTRAMUSCULAR | Status: DC | PRN
  Administered 2019-11-11: 15 mg via INTRAVENOUS

## 2019-11-11 SURGICAL SUPPLY — 34 items
BAG DRAIN CYSTO-URO LG1000N (MISCELLANEOUS) ×2 IMPLANT
BASKET ZERO TIP 1.9FR (BASKET) ×2 IMPLANT
BRUSH SCRUB EZ 1% IODOPHOR (MISCELLANEOUS) ×2 IMPLANT
BSKT STON RTRVL ZERO TP 1.9FR (BASKET) ×1
CATH URETL 5X70 OPEN END (CATHETERS) ×1 IMPLANT
CNTNR SPEC 2.5X3XGRAD LEK (MISCELLANEOUS)
CONT SPEC 4OZ STER OR WHT (MISCELLANEOUS)
CONT SPEC 4OZ STRL OR WHT (MISCELLANEOUS)
CONTAINER SPEC 2.5X3XGRAD LEK (MISCELLANEOUS) IMPLANT
DRAPE UTILITY 15X26 TOWEL STRL (DRAPES) ×2 IMPLANT
FIBER LASER TRAC TIP (UROLOGICAL SUPPLIES) ×2 IMPLANT
GLOVE BIOGEL PI IND STRL 7.5 (GLOVE) ×1 IMPLANT
GLOVE BIOGEL PI INDICATOR 7.5 (GLOVE) ×1
GOWN STRL REUS W/ TWL LRG LVL3 (GOWN DISPOSABLE) ×1 IMPLANT
GOWN STRL REUS W/ TWL XL LVL3 (GOWN DISPOSABLE) ×1 IMPLANT
GOWN STRL REUS W/TWL LRG LVL3 (GOWN DISPOSABLE) ×2
GOWN STRL REUS W/TWL XL LVL3 (GOWN DISPOSABLE) ×2
GUIDEWIRE STR DUAL SENSOR (WIRE) ×2 IMPLANT
INFUSOR MANOMETER BAG 3000ML (MISCELLANEOUS) ×2 IMPLANT
INTRODUCER DILATOR DOUBLE (INTRODUCER) IMPLANT
KIT TURNOVER CYSTO (KITS) ×2 IMPLANT
PACK CYSTO AR (MISCELLANEOUS) ×2 IMPLANT
SET CYSTO W/LG BORE CLAMP LF (SET/KITS/TRAYS/PACK) ×2 IMPLANT
SHEATH URETERAL 12FRX35CM (MISCELLANEOUS) IMPLANT
SOL .9 NS 3000ML IRR  AL (IV SOLUTION) ×2
SOL .9 NS 3000ML IRR AL (IV SOLUTION) ×1
SOL .9 NS 3000ML IRR UROMATIC (IV SOLUTION) ×1 IMPLANT
STENT URET 6FRX24 CONTOUR (STENTS) IMPLANT
STENT URET 6FRX26 CONTOUR (STENTS) IMPLANT
STENT URET 6FRX28 CONTOUR (STENTS) ×2 IMPLANT
SURGILUBE 2OZ TUBE FLIPTOP (MISCELLANEOUS) ×2 IMPLANT
SYR 10ML LL (SYRINGE) ×2 IMPLANT
VALVE UROSEAL ADJ ENDO (VALVE) IMPLANT
WATER STERILE IRR 1000ML POUR (IV SOLUTION) ×2 IMPLANT

## 2019-11-11 NOTE — Anesthesia Postprocedure Evaluation (Signed)
Anesthesia Post Note  Patient: Gregory Valencia  Procedure(s) Performed: CYSTOSCOPY/URETEROSCOPY/HOLMIUM LASER/STENT PLACEMENT (Left Ureter) CYSTOSCOPY WITH RETROGRADE PYELOGRAM (Left Ureter)  Patient location during evaluation: PACU Anesthesia Type: General Level of consciousness: awake and alert and oriented Pain management: pain level controlled Vital Signs Assessment: post-procedure vital signs reviewed and stable Respiratory status: spontaneous breathing, nonlabored ventilation and respiratory function stable Cardiovascular status: blood pressure returned to baseline and stable Postop Assessment: no signs of nausea or vomiting Anesthetic complications: no   No complications documented.   Last Vitals:  Vitals:   11/11/19 1215 11/11/19 1231  BP:  129/75  Pulse:  70  Resp:  18  Temp: 36.6 C (!) 36.3 C  SpO2:  97%    Last Pain:  Vitals:   11/11/19 1231  TempSrc: Temporal  PainSc: 0-No pain                 Lynsee Wands

## 2019-11-11 NOTE — Anesthesia Procedure Notes (Signed)
Procedure Name: Intubation Date/Time: 11/11/2019 10:53 AM Performed by: Willette Alma, CRNA Pre-anesthesia Checklist: Patient identified, Patient being monitored, Timeout performed, Emergency Drugs available and Suction available Patient Re-evaluated:Patient Re-evaluated prior to induction Oxygen Delivery Method: Circle system utilized Preoxygenation: Pre-oxygenation with 100% oxygen Induction Type: IV induction Ventilation: Mask ventilation without difficulty Laryngoscope Size: McGraph and 4 Grade View: Grade I Tube type: Oral Tube size: 7.5 mm Number of attempts: 1 Airway Equipment and Method: Stylet and Video-laryngoscopy Placement Confirmation: ETT inserted through vocal cords under direct vision,  positive ETCO2 and breath sounds checked- equal and bilateral Secured at: 21 cm Tube secured with: Tape Dental Injury: Teeth and Oropharynx as per pre-operative assessment
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
## 2019-11-11 NOTE — Discharge Instructions (Signed)
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
## 2019-11-11 NOTE — Transfer of Care (Signed)
Immediate Anesthesia Transfer of Care Note  Patient: Gregory Valencia  Procedure(s) Performed: CYSTOSCOPY/URETEROSCOPY/HOLMIUM LASER/STENT PLACEMENT (Left Ureter) CYSTOSCOPY WITH RETROGRADE PYELOGRAM (Left Ureter)  Patient Location: PACU  Anesthesia Type:General  Level of Consciousness: awake, alert  and oriented  Airway & Oxygen Therapy: Patient Spontanous Breathing and Patient connected to face mask oxygen  Post-op Assessment: Report given to RN and Post -op Vital signs reviewed and stable  Post vital signs: Reviewed and stable  Last Vitals:  Vitals Value Taken Time  BP 125/78 11/11/19 1147  Temp    Pulse 82 11/11/19 1148  Resp 16 11/11/19 1148  SpO2 99 % 11/11/19 1148  Vitals shown include unvalidated device data.  Last Pain:  Vitals:   11/11/19 1145  TempSrc:   PainSc: (P) 0-No pain         Complications: No complications documented.

## 2019-11-11 NOTE — Op Note (Signed)
Date of procedure: 11/11/19  Preoperative diagnosis:  1. Left distal ureteral stone, 7 mm  Postoperative diagnosis:  1. Same  Procedure: 1. Cystoscopy, left retrograde pyelogram with intraoperative interpretation, left ureteroscopy, laser lithotripsy, basket extraction of stones, left ureteral stent placement  Surgeon: Nickolas Madrid, MD  Anesthesia: General  Complications: None  Intraoperative findings:  1.  Normal cystoscopy, no abnormal findings 2.  Uncomplicated laser lithotripsy and basket extraction of left 7 mm distal stone 3.  Uncomplicated left ureteral stent placement  EBL: Minimal  Specimens: Stone for analysis  Drains: Left 6 French by 28 cm ureteral stent  Indication: Gregory Valencia is a 53 y.o. patient with gross hematuria who was found to have a 7 mm left distal ureteral stone on CT.  He opted for a trial of medical expulsive therapy, but did not pass a stone after 4 weeks and opted for intervention with ureteroscopy.  After reviewing the management options for treatment, they elected to proceed with the above surgical procedure(s). We have discussed the potential benefits and risks of the procedure, side effects of the proposed treatment, the likelihood of the patient achieving the goals of the procedure, and any potential problems that might occur during the procedure or recuperation. Informed consent has been obtained.  Description of procedure:  The patient was taken to the operating room and general anesthesia was induced.  DRE was performed and showed a 50 g prostate that was smooth with no nodules or masses.  SCDs were placed for DVT prophylaxis. The patient was placed in the dorsal lithotomy position, prepped and draped in the usual sterile fashion, and preoperative antibiotics were administered. A preoperative time-out was performed.   A 21 French rigid cystoscope was used to intubate the urethra and a normal-appearing urethra was followed proximally to the  bladder.  The prostate was moderate in size.  Thorough cystoscopy showed no abnormal findings, and the ureteral orifice ease were orthotopic bilaterally.  A sensor wire advanced easily into the left ureteral orifice and passed alongside the stone up into the kidney.  A semirigid ureteroscope then advanced alongside the wire with the aid of a second sensor wire to prop open the orifice, and a yellow stone was identified in the distal ureter.  This was fragmented using a 200 m laser fiber on settings of 1.0 J and 10 Hz.  A basket was then used to remove all fragments from the ureter into the bladder.  The ureteroscope was reentered into the ureter and a retrograde pyelogram showed no extravasation or filling defects.  The rigid cystoscope was backloaded over the wire and a 6 [place] by 28 cm ureteral stent was uneventfully placed with an excellent curl in the upper pole, as well as under direct vision the bladder.  There is brisk drainage of fluid through the side ports of the stent.  The bladder was drained.  Stone fragments were sent for analysis.  Belladonna suppository was placed  Disposition: Stable to PACU  Plan: Follow-up in clinic in 1 week for stent removal  Nickolas Madrid, MD

## 2019-11-12 ENCOUNTER — Encounter: Payer: Self-pay | Admitting: Urology

## 2019-11-17 ENCOUNTER — Encounter: Payer: Self-pay | Admitting: Urology

## 2019-11-17 ENCOUNTER — Other Ambulatory Visit: Payer: Self-pay

## 2019-11-17 ENCOUNTER — Ambulatory Visit (INDEPENDENT_AMBULATORY_CARE_PROVIDER_SITE_OTHER): Payer: BC Managed Care – PPO | Admitting: Urology

## 2019-11-17 VITALS — BP 125/78 | HR 71 | Ht 71.0 in | Wt 265.0 lb

## 2019-11-17 DIAGNOSIS — N201 Calculus of ureter: Secondary | ICD-10-CM | POA: Diagnosis not present

## 2019-11-17 NOTE — Patient Instructions (Signed)
Dietary Guidelines to Help Prevent Kidney Stones Kidney stones are deposits of minerals and salts that form inside your kidneys. Your risk of developing kidney stones may be greater depending on your diet, your lifestyle, the medicines you take, and whether you have certain medical conditions. Most people can reduce their chances of developing kidney stones by following the instructions below. Depending on your overall health and the type of kidney stones you tend to develop, your dietitian may give you more specific instructions. What are tips for following this plan? Reading food labels  Choose foods with "no salt added" or "low-salt" labels. Limit your sodium intake to less than 1500 mg per day.  Choose foods with calcium for each meal and snack. Try to eat about 300 mg of calcium at each meal. Foods that contain 200-500 mg of calcium per serving include: ? 8 oz (237 ml) of milk, fortified nondairy milk, and fortified fruit juice. ? 8 oz (237 ml) of kefir, yogurt, and soy yogurt. ? 4 oz (118 ml) of tofu. ? 1 oz of cheese. ? 1 cup (300 g) of dried figs. ? 1 cup (91 g) of cooked broccoli. ? 1-3 oz can of sardines or mackerel.  Most people need 1000 to 1500 mg of calcium each day. Talk to your dietitian about how much calcium is recommended for you. Shopping  Buy plenty of fresh fruits and vegetables. Most people do not need to avoid fruits and vegetables, even if they contain nutrients that may contribute to kidney stones.  When shopping for convenience foods, choose: ? Whole pieces of fruit. ? Premade salads with dressing on the side. ? Low-fat fruit and yogurt smoothies.  Avoid buying frozen meals or prepared deli foods.  Look for foods with live cultures, such as yogurt and kefir. Cooking  Do not add salt to food when cooking. Place a salt shaker on the table and allow each person to add his or her own salt to taste.  Use vegetable protein, such as beans, textured vegetable  protein (TVP), or tofu instead of meat in pasta, casseroles, and soups. Meal planning   Eat less salt, if told by your dietitian. To do this: ? Avoid eating processed or premade food. ? Avoid eating fast food.  Eat less animal protein, including cheese, meat, poultry, or fish, if told by your dietitian. To do this: ? Limit the number of times you have meat, poultry, fish, or cheese each week. Eat a diet free of meat at least 2 days a week. ? Eat only one serving each day of meat, poultry, fish, or seafood. ? When you prepare animal protein, cut pieces into small portion sizes. For most meat and fish, one serving is about the size of one deck of cards.  Eat at least 5 servings of fresh fruits and vegetables each day. To do this: ? Keep fruits and vegetables on hand for snacks. ? Eat 1 piece of fruit or a handful of berries with breakfast. ? Have a salad and fruit at lunch. ? Have two kinds of vegetables at dinner.  Limit foods that are high in a substance called oxalate. These include: ? Spinach. ? Rhubarb. ? Beets. ? Potato chips and french fries. ? Nuts.  If you regularly take a diuretic medicine, make sure to eat at least 1-2 fruits or vegetables high in potassium each day. These include: ? Avocado. ? Banana. ? Orange, prune, carrot, or tomato juice. ? Baked potato. ? Cabbage. ? Beans and split   peas. General instructions   Drink enough fluid to keep your urine clear or pale yellow. This is the most important thing you can do.  Talk to your health care provider and dietitian about taking daily supplements. Depending on your health and the cause of your kidney stones, you may be advised: ? Not to take supplements with vitamin C. ? To take a calcium supplement. ? To take a daily probiotic supplement. ? To take other supplements such as magnesium, fish oil, or vitamin B6.  Take all medicines and supplements as told by your health care provider.  Limit alcohol intake to no  more than 1 drink a day for nonpregnant women and 2 drinks a day for men. One drink equals 12 oz of beer, 5 oz of wine, or 1 oz of hard liquor.  Lose weight if told by your health care provider. Work with your dietitian to find strategies and an eating plan that works best for you. What foods are not recommended? Limit your intake of the following foods, or as told by your dietitian. Talk to your dietitian about specific foods you should avoid based on the type of kidney stones and your overall health. Grains Breads. Bagels. Rolls. Baked goods. Salted crackers. Cereal. Pasta. Vegetables Spinach. Rhubarb. Beets. Canned vegetables. Pickles. Olives. Meats and other protein foods Nuts. Nut butters. Large portions of meat, poultry, or fish. Salted or cured meats. Deli meats. Hot dogs. Sausages. Dairy Cheese. Beverages Regular soft drinks. Regular vegetable juice. Seasonings and other foods Seasoning blends with salt. Salad dressings. Canned soups. Soy sauce. Ketchup. Barbecue sauce. Canned pasta sauce. Casseroles. Pizza. Lasagna. Frozen meals. Potato chips. French fries. Summary  You can reduce your risk of kidney stones by making changes to your diet.  The most important thing you can do is drink enough fluid. You should drink enough fluid to keep your urine clear or pale yellow.  Ask your health care provider or dietitian how much protein from animal sources you should eat each day, and also how much salt and calcium you should have each day. This information is not intended to replace advice given to you by your health care provider. Make sure you discuss any questions you have with your health care provider. Document Revised: 09/08/2018 Document Reviewed: 04/29/2016 Elsevier Patient Education  2020 Elsevier Inc.  

## 2019-11-17 NOTE — Progress Notes (Signed)
Cystoscopy Procedure Note:  Indication: Stent removal s/p 11/11/2019 left ureteroscopy for 7 mm distal stone  After informed consent and discussion of the procedure and its risks, Gregory Valencia was positioned and prepped in the standard fashion. Cystoscopy was performed with a flexible cystoscope. The stent was grasped with flexible graspers and removed in its entirety. The patient tolerated the procedure well.  Findings: Uncomplicated stent removal  Assessment and Plan: Follow up in 4 weeks with renal ultrasound to evaluate for silent hydronephrosis Review stone analysis and stone prevention at that visit  Billey Co, MD 11/17/2019

## 2019-11-21 LAB — CALCULI, WITH PHOTOGRAPH (CLINICAL LAB)
Calcium Oxalate Dihydrate: 20 %
Calcium Oxalate Monohydrate: 80 %
Weight Calculi: 2 mg

## 2019-11-21 LAB — URINALYSIS, COMPLETE
Bilirubin, UA: NEGATIVE
Glucose, UA: NEGATIVE
Ketones, UA: NEGATIVE
Nitrite, UA: NEGATIVE
Specific Gravity, UA: >1.03 — ABNORMAL HIGH (ref 1.005–1.030)
Urobilinogen, Ur: 0.2 mg/dL (ref 0.2–1.0)
pH, UA: 5 (ref 5.0–7.5)

## 2019-11-21 LAB — MICROSCOPIC EXAMINATION
Bacteria, UA: NONE SEEN
RBC, Urine: >30 /hpf — AB (ref 0–2)

## 2019-12-19 ENCOUNTER — Ambulatory Visit
Admission: RE | Admit: 2019-12-19 | Discharge: 2019-12-19 | Disposition: A | Discharge status: Home / Self Care | Payer: BC Managed Care – PPO | Source: Ambulatory Visit | Attending: Urology | Admitting: Urology

## 2019-12-19 ENCOUNTER — Other Ambulatory Visit: Payer: Self-pay

## 2019-12-19 DIAGNOSIS — K760 Fatty (change of) liver, not elsewhere classified: Secondary | ICD-10-CM | POA: Diagnosis not present

## 2019-12-19 DIAGNOSIS — N201 Calculus of ureter: Secondary | ICD-10-CM | POA: Diagnosis not present

## 2019-12-19 DIAGNOSIS — N400 Enlarged prostate without lower urinary tract symptoms: Secondary | ICD-10-CM | POA: Diagnosis not present

## 2019-12-19 DIAGNOSIS — N281 Cyst of kidney, acquired: Secondary | ICD-10-CM | POA: Diagnosis not present

## 2019-12-29 ENCOUNTER — Ambulatory Visit: Payer: Self-pay | Admitting: Urology

## 2019-12-29 ENCOUNTER — Other Ambulatory Visit: Payer: Self-pay

## 2019-12-29 ENCOUNTER — Ambulatory Visit (INDEPENDENT_AMBULATORY_CARE_PROVIDER_SITE_OTHER): Payer: BC Managed Care – PPO | Admitting: Urology

## 2019-12-29 ENCOUNTER — Encounter: Payer: Self-pay | Admitting: Urology

## 2019-12-29 VITALS — BP 135/79 | HR 83 | Ht 73.0 in | Wt 257.0 lb

## 2019-12-29 DIAGNOSIS — N200 Calculus of kidney: Secondary | ICD-10-CM | POA: Diagnosis not present

## 2019-12-29 DIAGNOSIS — Z125 Encounter for screening for malignant neoplasm of prostate: Secondary | ICD-10-CM

## 2019-12-29 NOTE — Progress Notes (Signed)
   12/29/2019 3:13 PM   Rolm Gala [birth year] [id number]  Reason for visit: Follow up nephrolithiasis, PSA screening  HPI: I saw Mr. Yoo back in urology clinic for follow-up after undergoing left ureteroscopy, laser lithotripsy, and stent placement for a 7 mm left distal ureteral stone with gross hematuria on 11/11/2019.  Stone type was calcium oxalate.  He denies any problems since we saw him last, specifically any gross hematuria or flank pain.  Routine follow-up renal ultrasound on 12/19/2019 was benign with no hydronephrosis or stones.  PSA was normal at 2.0.  We reviewed the AUA guidelines that recommend yearly PSA screening for men age 56-70, as well as the risks and benefits.  We discussed general stone prevention strategies including adequate hydration with goal of producing 2.5 L of urine daily, increasing citric acid intake, increasing calcium intake during high oxalate meals, minimizing animal protein, and decreasing salt intake. Information about dietary recommendations given today.   RTC 1 year with KUB prior  I spent 30 total minutes on the day of the encounter including pre-visit review of the medical record, face-to-face time with the patient, and post visit ordering of labs/imaging/tests.  Billey Co, [facility] [address], [place], [place] [address] [phone]

## 2019-12-29 NOTE — Patient Instructions (Signed)
Dietary Guidelines to Help Prevent Kidney Stones Kidney stones are deposits of minerals and salts that form inside your kidneys. Your risk of developing kidney stones may be greater depending on your diet, your lifestyle, the medicines you take, and whether you have certain medical conditions. Most people can reduce their chances of developing kidney stones by following the instructions below. Depending on your overall health and the type of kidney stones you tend to develop, your dietitian may give you more specific instructions. What are tips for following this plan? Reading food labels  Choose foods with "no salt added" or "low-salt" labels. Limit your sodium intake to less than 1500 mg per day.  Choose foods with calcium for each meal and snack. Try to eat about 300 mg of calcium at each meal. Foods that contain 200-500 mg of calcium per serving include: ? 8 oz (237 ml) of milk, fortified nondairy milk, and fortified fruit juice. ? 8 oz (237 ml) of kefir, yogurt, and soy yogurt. ? 4 oz (118 ml) of tofu. ? 1 oz of cheese. ? 1 cup (300 g) of dried figs. ? 1 cup (91 g) of cooked broccoli. ? 1-3 oz can of sardines or mackerel.  Most people need 1000 to 1500 mg of calcium each day. Talk to your dietitian about how much calcium is recommended for you. Shopping  Buy plenty of fresh fruits and vegetables. Most people do not need to avoid fruits and vegetables, even if they contain nutrients that may contribute to kidney stones.  When shopping for convenience foods, choose: ? Whole pieces of fruit. ? Premade salads with dressing on the side. ? Low-fat fruit and yogurt smoothies.  Avoid buying frozen meals or prepared deli foods.  Look for foods with live cultures, such as yogurt and kefir. Cooking  Do not add salt to food when cooking. Place a salt shaker on the table and allow each person to add his or her own salt to taste.  Use vegetable protein, such as beans, textured vegetable  protein (TVP), or tofu instead of meat in pasta, casseroles, and soups. Meal planning   Eat less salt, if told by your dietitian. To do this: ? Avoid eating processed or premade food. ? Avoid eating fast food.  Eat less animal protein, including cheese, meat, poultry, or fish, if told by your dietitian. To do this: ? Limit the number of times you have meat, poultry, fish, or cheese each week. Eat a diet free of meat at least 2 days a week. ? Eat only one serving each day of meat, poultry, fish, or seafood. ? When you prepare animal protein, cut pieces into small portion sizes. For most meat and fish, one serving is about the size of one deck of cards.  Eat at least 5 servings of fresh fruits and vegetables each day. To do this: ? Keep fruits and vegetables on hand for snacks. ? Eat 1 piece of fruit or a handful of berries with breakfast. ? Have a salad and fruit at lunch. ? Have two kinds of vegetables at dinner.  Limit foods that are high in a substance called oxalate. These include: ? Spinach. ? Rhubarb. ? Beets. ? Potato chips and french fries. ? Nuts.  If you regularly take a diuretic medicine, make sure to eat at least 1-2 fruits or vegetables high in potassium each day. These include: ? Avocado. ? Banana. ? Orange, prune, carrot, or tomato juice. ? Baked potato. ? Cabbage. ? Beans and split   peas. General instructions   Drink enough fluid to keep your urine clear or pale yellow. This is the most important thing you can do.  Talk to your health care provider and dietitian about taking daily supplements. Depending on your health and the cause of your kidney stones, you may be advised: ? Not to take supplements with vitamin C. ? To take a calcium supplement. ? To take a daily probiotic supplement. ? To take other supplements such as magnesium, fish oil, or vitamin B6.  Take all medicines and supplements as told by your health care provider.  Limit alcohol intake to no  more than 1 drink a day for nonpregnant women and 2 drinks a day for men. One drink equals 12 oz of beer, 5 oz of wine, or 1 oz of hard liquor.  Lose weight if told by your health care provider. Work with your dietitian to find strategies and an eating plan that works best for you. What foods are not recommended? Limit your intake of the following foods, or as told by your dietitian. Talk to your dietitian about specific foods you should avoid based on the type of kidney stones and your overall health. Grains Breads. Bagels. Rolls. Baked goods. Salted crackers. Cereal. Pasta. Vegetables Spinach. Rhubarb. Beets. Canned vegetables. Pickles. Olives. Meats and other protein foods Nuts. Nut butters. Large portions of meat, poultry, or fish. Salted or cured meats. Deli meats. Hot dogs. Sausages. Dairy Cheese. Beverages Regular soft drinks. Regular vegetable juice. Seasonings and other foods Seasoning blends with salt. Salad dressings. Canned soups. Soy sauce. Ketchup. Barbecue sauce. Canned pasta sauce. Casseroles. Pizza. Lasagna. Frozen meals. Potato chips. French fries. Summary  You can reduce your risk of kidney stones by making changes to your diet.  The most important thing you can do is drink enough fluid. You should drink enough fluid to keep your urine clear or pale yellow.  Ask your health care provider or dietitian how much protein from animal sources you should eat each day, and also how much salt and calcium you should have each day. This information is not intended to replace advice given to you by your health care provider. Make sure you discuss any questions you have with your health care provider. Document Revised: 09/08/2018 Document Reviewed: 04/29/2016 Elsevier Patient Education  2020 Elsevier Inc.  

## 2020-01-03 ENCOUNTER — Telehealth: Payer: Self-pay | Admitting: Family Medicine

## 2020-01-03 DIAGNOSIS — R599 Enlarged lymph nodes, unspecified: Secondary | ICD-10-CM

## 2020-01-03 NOTE — Telephone Encounter (Signed)
Patient advised.

## 2020-01-03 NOTE — Telephone Encounter (Signed)
Patient says that he saw Dr. Diamantina Providence and he ordered an [place] and Dr. Diamantina Providence said he did not see anything to be concerned about.  This has been done since the patient's initial CT.  Patient is asking if something has changed since his visit with Dr. Diamantina Providence last Thursday?

## 2020-01-03 NOTE — Telephone Encounter (Signed)
Thanks

## 2020-01-03 NOTE — Telephone Encounter (Signed)
Call patient.  Previous CT showed a borderline enlarged lymph node in the RIGHT pelvis with small nodes throughout the pelvis.  Reasonable to recheck with CT to make sure this is not enlarged, to make sure this has resolved.  I put in the order.  Urology MD agreed.  Thanks.

## 2020-01-03 NOTE — Telephone Encounter (Signed)
Best number [phone] Pt returned your call

## 2020-01-03 NOTE — Telephone Encounter (Signed)
Left message on patient's voicemail to return call

## 2020-01-03 NOTE — Telephone Encounter (Signed)
The issue was the lymph node on the initial CT.  He needs f/u CT since the node can't be evaluated by u/s.  And I asked Diamantina Providence and he agreed that the CT should be done.  Thanks.

## 2020-01-03 NOTE — Telephone Encounter (Signed)
Patient is aware of CT scan - this has been scheduled for 01/24/20 at 8:30AM at [facility] in [place], contrast placed up front with instructions for pick up. Varney Daily, CMA)

## 2020-01-24 ENCOUNTER — Other Ambulatory Visit: Payer: Self-pay

## 2020-01-24 ENCOUNTER — Ambulatory Visit (INDEPENDENT_AMBULATORY_CARE_PROVIDER_SITE_OTHER)
Admission: RE | Admit: 2020-01-24 | Discharge: 2020-01-24 | Disposition: A | Discharge status: Home / Self Care | Payer: BC Managed Care – PPO | Source: Ambulatory Visit | Attending: Family Medicine | Admitting: Family Medicine

## 2020-01-24 DIAGNOSIS — N281 Cyst of kidney, acquired: Secondary | ICD-10-CM | POA: Diagnosis not present

## 2020-01-24 DIAGNOSIS — N400 Enlarged prostate without lower urinary tract symptoms: Secondary | ICD-10-CM | POA: Diagnosis not present

## 2020-01-24 DIAGNOSIS — R599 Enlarged lymph nodes, unspecified: Secondary | ICD-10-CM | POA: Diagnosis not present

## 2020-01-24 DIAGNOSIS — Z9049 Acquired absence of other specified parts of digestive tract: Secondary | ICD-10-CM | POA: Diagnosis not present

## 2020-01-24 MED ORDER — IOHEXOL 300 MG/ML  SOLN
100.0000 mL | Freq: Once | INTRAMUSCULAR | Status: AC | PRN
  Administered 2020-01-24: 100 mL via INTRAVENOUS

## 2020-03-19 ENCOUNTER — Encounter: Payer: Self-pay | Admitting: Family Medicine

## 2020-03-26 ENCOUNTER — Other Ambulatory Visit: Payer: Self-pay

## 2020-03-26 ENCOUNTER — Encounter: Payer: Self-pay | Admitting: Family Medicine

## 2020-03-26 ENCOUNTER — Ambulatory Visit (INDEPENDENT_AMBULATORY_CARE_PROVIDER_SITE_OTHER): Payer: BC Managed Care – PPO | Admitting: Family Medicine

## 2020-03-26 VITALS — BP 126/84 | HR 70 | Temp 96.9°F | Ht 73.0 in | Wt 254.4 lb

## 2020-03-26 DIAGNOSIS — R0683 Snoring: Secondary | ICD-10-CM | POA: Diagnosis not present

## 2020-03-26 NOTE — Progress Notes (Signed)
This visit occurred during the SARS-CoV-2 public health emergency.  Safety protocols were in place, including screening questions prior to the visit, additional usage of staff PPE, and extensive cleaning of exam room while observing appropriate contact time as indicated for disinfecting solutions.  "People ask me if I snore."  [place].  Others heard him snoring/gasping.  Some fatigue.  No high risk events.  No AM HA.    Meds, vitals, and allergies reviewed.   ROS: Per HPI unless specifically indicated in ROS section   GEN: nad, alert and oriented HEENT: ncat NECK: supple w/o LA CV: rrr.   PULM: ctab, no inc wob ABD: soft, +bs EXT: no edema SKIN: no acute rash 17.5" neck.

## 2020-03-26 NOTE — Patient Instructions (Signed)
We'll call about seeing pulmonary and they should be able to set you up with testing.  Take care.  Glad to see you.

## 2020-03-28 DIAGNOSIS — R0683 Snoring: Secondary | ICD-10-CM | POA: Insufficient documentation

## 2020-03-28 NOTE — Assessment & Plan Note (Signed)
Sleep apnea pathophysiology discussed with patient.  Referred to pulmonary for consideration of sleep apnea testing.  Routine cautions given to patient especially regarding driving if fatigued.  Questions answered.  He will update me as needed.  I thank all involved.  Discussed weight reduction with diet and exercise.

## 2020-05-17 ENCOUNTER — Encounter: Payer: Self-pay | Admitting: Pulmonary Disease

## 2020-05-17 ENCOUNTER — Ambulatory Visit (INDEPENDENT_AMBULATORY_CARE_PROVIDER_SITE_OTHER): Payer: BC Managed Care – PPO | Admitting: Pulmonary Disease

## 2020-05-17 ENCOUNTER — Other Ambulatory Visit: Payer: Self-pay

## 2020-05-17 VITALS — BP 124/72 | HR 83 | Temp 98.0°F | Ht 73.0 in | Wt 256.2 lb

## 2020-05-17 DIAGNOSIS — R0683 Snoring: Secondary | ICD-10-CM | POA: Diagnosis not present

## 2020-05-17 NOTE — Progress Notes (Signed)
[facility]  Chief Complaint  Patient presents with  . pulmonary consult    Per Dr. Talmage Nap prior sleep study. C/o loud snoring, stops breathing during sleep and daytime sleepiness x3y    Constitutional:  BP 124/72 (BP Location: Left Arm, Cuff Size: Normal)   Pulse 83   Temp 98 F (36.7 C) (Temporal)   Ht 6\' 1"  (1.854 m)   Wt 256 lb 3.2 oz (116.2 kg)   SpO2 96%   BMI 33.80 kg/m   Past Medical History:  GERD  Past Surgical History:  His  has a past surgical history that includes arm surgery (Left); Upper gi endoscopy; Colonoscopy; Laparoscopic cholecystectomy single site with intraoperative cholangiogram (N/A, 07/20/2014); Cystoscopy/ureteroscopy/holmium laser/stent placement (Left, 11/11/2019); and Cystoscopy w/ retrogrades (Left, 11/11/2019).  Brief Summary:  Gregory Valencia is a 54 y.o. male former smoker with snoring.  He is a Administrator.      Subjective:   His wife has been concerned about his snoring for years.  This has been getting worse.  He will wake up with a snort and stop breathing while asleep.  He has noticed more trouble staying alert working on a computer.  His father had heart disease and sleep apnea, and was on a CPAP machine.  He was initially reluctant to test for sleep apnea, but spoke with a coworker who in on CPAP with good response.  He then decided to have further assessment.  He goes to sleep between 8 and 930 pm.  He falls asleep quickly.  He wakes up 2 or 3 times to use the bathroom.  He gets out of bed at 430 am.  He feels tired in the morning.  He denies morning headache.  He does not use anything to help him fall sleep.  He drinks coffee in the morning with breakfast.  He denies sleep walking, sleep talking, bruxism, or nightmares.  There is no history of restless legs.  He denies sleep hallucinations, sleep paralysis, or cataplexy.  The Epworth score is 12 out of 24.   Physical Exam:   Appearance  - well kempt   ENMT - no sinus tenderness, no oral exudate, no LAN, Mallampati 4 airway, no stridor, scalloped tongue, 2+ tonsils, elongated uvula  Respiratory - equal breath sounds bilaterally, no wheezing or rales  CV - s1s2 regular rate and rhythm, no murmurs  Ext - no clubbing, no edema  Skin - no rashes  Psych - normal mood and affect   Sleep Tests:    Social History:  He  reports that he quit smoking about 14 years ago. His smoking use included cigarettes. He has a 40.50 pack-year smoking history. He has never used smokeless tobacco. He reports current alcohol use. He reports that he does not use drugs.  Family History:  His family history includes Depression in his brother; Heart disease in his father; Hypertension in his father; Stroke in his father.    Discussion:  He has snoring, sleep disruption, apnea, and daytime sleepiness. He has family history of sleep apnea.  I am concerned he could have obstructive sleep apnea.  Assessment/Plan:   Snoring with excessive daytime sleepiness. - will need to arrange for a home sleep study  Obesity. - discussed how weight can impact sleep and risk for sleep disordered breathing - discussed options to assist with weight loss: combination of diet modification, cardiovascular and strength training exercises  Cardiovascular risk. - had an extensive discussion regarding  the adverse health consequences related to untreated sleep disordered breathing - specifically discussed the risks for hypertension, coronary artery disease, cardiac dysrhythmias, cerebrovascular disease, and diabetes - lifestyle modification discussed  Safe driving practices. - discussed how sleep disruption can increase risk of accidents, particularly when driving - safe driving practices were discussed  Therapies for obstructive sleep apnea. - if the sleep study shows significant sleep apnea, then various therapies for treatment were reviewed: CPAP, oral  appliance, and surgical interventions  Time Spent Involved in Patient Care on Day of Examination:  32 minutes  Follow up:  Patient Instructions  Will arrange for home sleep study Will call to arrange for follow up after sleep study reviewed    Medication List:   Allergies as of 05/17/2020   No Known Allergies     Medication List       Accurate as of May 17, 2020 11:25 AM. If you have any questions, ask your nurse or doctor.        MULTI VITAMIN DAILY PO Take 1 tablet by mouth daily.   pantoprazole 40 MG tablet Commonly known as: PROTONIX TAKE 1 TABLET BY MOUTH EVERY MORNING BEFORE BREAKFAST       Signature:  Chesley Mires, MD [place] Pager - [phone] 05/17/2020, 11:25 AM

## 2020-05-17 NOTE — Patient Instructions (Signed)
Will arrange for home sleep study Will call to arrange for follow up after sleep study reviewed  

## 2020-05-30 ENCOUNTER — Encounter: Payer: Self-pay | Admitting: Internal Medicine

## 2020-05-30 ENCOUNTER — Ambulatory Visit: Payer: BC Managed Care – PPO | Admitting: Internal Medicine

## 2020-05-30 ENCOUNTER — Other Ambulatory Visit: Payer: Self-pay

## 2020-05-30 ENCOUNTER — Ambulatory Visit (INDEPENDENT_AMBULATORY_CARE_PROVIDER_SITE_OTHER)
Admission: RE | Admit: 2020-05-30 | Discharge: 2020-05-30 | Disposition: A | Discharge status: Home / Self Care | Payer: BC Managed Care – PPO | Source: Ambulatory Visit | Attending: Internal Medicine | Admitting: Internal Medicine

## 2020-05-30 DIAGNOSIS — R109 Unspecified abdominal pain: Secondary | ICD-10-CM | POA: Diagnosis not present

## 2020-05-30 LAB — POC URINALSYSI DIPSTICK (AUTOMATED)
Blood, UA: NEGATIVE
Glucose, UA: NEGATIVE
Leukocytes, UA: NEGATIVE
Nitrite, UA: NEGATIVE
Protein, UA: POSITIVE — AB
Spec Grav, UA: 1.025 (ref 1.010–1.025)
Urobilinogen, UA: 0.2 E.U./dL
pH, UA: 6 (ref 5.0–8.0)
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
## 2020-05-30 NOTE — Assessment & Plan Note (Addendum)
Symptoms really suggest shingles--but had this in another location in the past and no rash Could be kidney stone---didn't have pain in the past. Will check urinalysis LLQ tenderness could suggest diverticulitis--but colon 6 years ago didn't show diverticulosis. CT scan may be needed if pain persists Could be obstipation----he thinks his habits are much changed. Will check KUB to assess stool burden  Urinalysis negative KUB shows heavy stool burden---no clear stone Will discuss bowel regimen Consider CT if persists

## 2020-05-30 NOTE — Patient Instructions (Signed)
Please take miralax a full cap in a glass of water three times a day and add senna-s 2 tabs twice a day. If you have not cleared out well by tomorrow evening, try a dulcolax tab or suppository or repeat the enema.

## 2020-05-31 ENCOUNTER — Ambulatory Visit: Payer: BC Managed Care – PPO

## 2020-05-31 ENCOUNTER — Other Ambulatory Visit: Payer: Self-pay

## 2020-05-31 DIAGNOSIS — G4733 Obstructive sleep apnea (adult) (pediatric): Secondary | ICD-10-CM | POA: Diagnosis not present

## 2020-05-31 DIAGNOSIS — R0683 Snoring: Secondary | ICD-10-CM

## 2020-06-06 ENCOUNTER — Telehealth: Payer: Self-pay | Admitting: Pulmonary Disease

## 2020-06-06 DIAGNOSIS — G4733 Obstructive sleep apnea (adult) (pediatric): Secondary | ICD-10-CM | POA: Diagnosis not present

## 2020-06-06 NOTE — Telephone Encounter (Signed)
Patient returned phone call, writer went over HST results per Dr Craige Cotta with patient. All questions answered and patient expressed full understanding. Scheduled office visit with NP for tomorrow (Thursday) 06/07/2020 at 3:30 pm at the [facility] office to discuss treatment options. Patient agreeable to time, date and location. Nothing further needed at this time.

## 2020-06-06 NOTE — Telephone Encounter (Signed)
HST 06/02/20 >> AHI 33.1, SpO2 low 83%  Please inform him that his sleep study shows severe obstructive sleep apnea.  Please arrange for ROV with me or NP to discuss treatment options.

## 2020-06-06 NOTE — Telephone Encounter (Signed)
Called and left message on voicemail to please return phone call to go over HST results. Contact number provided.  

## 2020-06-07 ENCOUNTER — Other Ambulatory Visit: Payer: Self-pay

## 2020-06-07 ENCOUNTER — Encounter: Payer: Self-pay | Admitting: Pulmonary Disease

## 2020-06-07 ENCOUNTER — Ambulatory Visit: Payer: BC Managed Care – PPO | Admitting: Pulmonary Disease

## 2020-06-07 VITALS — BP 118/74 | HR 76 | Temp 98.7°F | Ht 74.0 in | Wt 256.6 lb

## 2020-06-07 DIAGNOSIS — Z87891 Personal history of nicotine dependence: Secondary | ICD-10-CM | POA: Diagnosis not present

## 2020-06-07 DIAGNOSIS — Z0000 Encounter for general adult medical examination without abnormal findings: Secondary | ICD-10-CM | POA: Insufficient documentation

## 2020-06-07 DIAGNOSIS — G4733 Obstructive sleep apnea (adult) (pediatric): Secondary | ICD-10-CM

## 2020-06-07 NOTE — Assessment & Plan Note (Signed)
Former smoker Quit 2007 40-pack-year smoking history  Plan: Referral to lung cancer screening program

## 2020-06-07 NOTE — Patient Instructions (Addendum)
You were seen today by Coral Ceo, NP  for:   1. OSA (obstructive sleep apnea)  New CPAP start DME: [facility] APAP setting 5-15 Mask of choice Supplies  Please contact our office when you receive your CPAP and schedule a 68-month follow-up with our practice so we can check your compliance  We recommend that you start using your CPAP daily >>>Keep up the hard work using your device >>> Goal should be wearing this for the entire night that you are sleeping, at least 4 to 6 hours  Remember:  . Do not drive or operate heavy machinery if tired or drowsy.  . Please notify the supply company and office if you are unable to use your device regularly due to missing supplies or machine being broken.  . Work on maintaining a healthy weight and following your recommended nutrition plan  . Maintain proper daily exercise and movement  . Maintaining proper use of your device can also help improve management of other chronic illnesses such as: Blood pressure, blood sugars, and weight management.   BiPAP/ CPAP Cleaning:  >>>Clean weekly, with Dawn soap, and bottle brush.  Set up to air dry. >>> Wipe mask out daily with wet wipe or towelette    2. Former smoker  We will refer you today to our lung cancer screening program >>>This is based off of your 40 pack-year smoking history >>> This is a recommendation from the [place] preventative services task force (USPSTF) >>>The USPSTF recommends annual screening for lung cancer with low-dose computed tomography (LDCT) in adults aged 12 to 80 years who have a 20 pack-year smoking history and currently smoke or have quit within the past 15 years. Screening should be discontinued once a person has not smoked for 15 years or develops a health problem that substantially limits life expectancy or the ability or willingness to have curative lung surgery.   Our office will call you and set up an appointment with Kandice Robinsons (Nurse Practitioner) who leads this  program.  This appointment takes place in our office.  After completing this meeting with Kandice Robinsons NP you will get a low-dose CT as the screening >>>We will call you with those results    3. Healthcare maintenance  We recommend the seasonal flu vaccine, you declined this today  We recommend the COVID-19 vaccinations, you declined this today    Follow Up:    Return in about 3 months (around 09/05/2020), or if symptoms worsen or fail to improve, for Follow up with Dr. Craige Cotta.   Notification of test results are managed in the following manner: If there are  any recommendations or changes to the  plan of care discussed in office today,  we will contact you and let you know what they are. If you do not hear from [place], then your results are normal and you can view them through your  MyChart account , or a letter will be sent to you. Thank you again for trusting [place] with your care  - Thank you, [facility]    It is flu season:   >>> Best ways to protect herself from the flu: Receive the yearly flu vaccine, practice good hand hygiene washing with soap and also using hand sanitizer when available, eat a nutritious meals, get adequate rest, hydrate appropriately       Please contact the office if your symptoms worsen or you have concerns that you are not improving.   Thank you for choosing [facility] for your healthcare, and for allowing [place] to partner with you on your healthcare journey. I am thankful to be able to provide care to you today.   Wyn Quaker FNP-C
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
## 2020-06-07 NOTE — Assessment & Plan Note (Signed)
Recommend seasonal flu vaccine, patient declined Recommend COVID-19 vaccinations, patient declined

## 2020-06-07 NOTE — Assessment & Plan Note (Signed)
Severe obstructive sleep apnea Discussed pathophysiology of obstructive sleep apnea Discussed treatment options such as oral appliance, weight loss, CPAP therapy  Plan: Proceed forward with CPAP start DME of choice [facility] APAP setting 5-15 Patient to contact her office and schedule II month follow-up once he receives his CPAP

## 2020-06-08 NOTE — Progress Notes (Signed)
Reviewed and agree with assessment/plan.   Chesley Mires, MD [facility] 06/08/2020, 8:54 AM Pager:  [phone]

## 2020-06-14 ENCOUNTER — Telehealth: Payer: Self-pay | Admitting: Acute Care

## 2020-06-19 NOTE — Telephone Encounter (Signed)
Attempted to call pt. Left message for pt to call back to schedule lung cancer screening.

## 2020-06-21 ENCOUNTER — Telehealth: Payer: Self-pay | Admitting: Pulmonary Disease

## 2020-06-21 ENCOUNTER — Other Ambulatory Visit: Payer: Self-pay | Admitting: *Deleted

## 2020-06-21 DIAGNOSIS — Z87891 Personal history of nicotine dependence: Secondary | ICD-10-CM

## 2020-06-21 NOTE — Telephone Encounter (Signed)
Patient returning call Gregory Valencia,  routing to her

## 2020-06-21 NOTE — Telephone Encounter (Signed)
Spoke with pt and scheduled Shared decision visit 07/09/20 4:30 televisit. CT ordered and will be scheduled Nothing further needed

## 2020-06-21 NOTE — Telephone Encounter (Signed)
Left message for pt to call back. Will close this message and refer to referral notes.

## 2020-07-09 ENCOUNTER — Ambulatory Visit (INDEPENDENT_AMBULATORY_CARE_PROVIDER_SITE_OTHER): Payer: BC Managed Care – PPO | Admitting: Acute Care

## 2020-07-09 ENCOUNTER — Other Ambulatory Visit: Payer: Self-pay

## 2020-07-09 ENCOUNTER — Encounter: Payer: Self-pay | Admitting: Acute Care

## 2020-07-09 DIAGNOSIS — Z122 Encounter for screening for malignant neoplasm of respiratory organs: Secondary | ICD-10-CM | POA: Diagnosis not present

## 2020-07-09 DIAGNOSIS — Z87891 Personal history of nicotine dependence: Secondary | ICD-10-CM | POA: Diagnosis not present

## 2020-07-09 NOTE — Progress Notes (Signed)
Virtual Visit via Telephone Note  I connected with Gregory Valencia on 07/09/20 at  4:30 PM EST by telephone and verified that I am speaking with the correct person using two identifiers.  Location: Patient: At home Provider: At Home   I discussed the limitations, risks, security and privacy concerns of performing an evaluation and management service by telephone and the availability of in person appointments. I also discussed with the patient that there may be a patient responsible charge related to this service. The patient expressed understanding and agreed to proceed.    Shared Decision Making Visit Lung Cancer Screening Program [phone])   Eligibility:  Age 55 y.o.  Pack Years Smoking History Calculation 32 pack year smoking history (# packs/per year x # years smoked)  Recent History of coughing up blood  no  Unexplained weight loss? no ( >Than 15 pounds within the last 6 months )  Prior History Lung / other cancer no (Diagnosis within the last 5 years already requiring surveillance chest CT Scans).  Smoking Status Former Smoker  Former Smokers: Years since quit: 15 years  Quit Date: 2002  Visit Components:  Discussion included one or more decision making aids. yes  Discussion included risk/benefits of screening. yes  Discussion included potential follow up diagnostic testing for abnormal scans. yes  Discussion included meaning and risk of over diagnosis. yes  Discussion included meaning and risk of False Positives. yes  Discussion included meaning of total radiation exposure. yes  Counseling Included:  Importance of adherence to annual lung cancer LDCT screening. yes  Impact of comorbidities on ability to participate in the program. yes  Ability and willingness to under diagnostic treatment. yes  Smoking Cessation Counseling:  Current Smokers:   Discussed importance of smoking cessation. yes  Information about tobacco cessation classes and  interventions provided to patient. yes  Patient provided with "ticket" for LDCT Scan. yes  Symptomatic Patient. no  Counseling NA  Diagnosis Code: Tobacco Use Z72.0  Asymptomatic Patient yes  Counseling (Intermediate counseling: > three minutes counseling) V8938  Former Smokers:   Discussed the importance of maintaining cigarette abstinence. yes  Diagnosis Code: Personal History of Nicotine Dependence. B01.751  Information about tobacco cessation classes and interventions provided to patient. Yes  Patient provided with "ticket" for LDCT Scan. yes  Written Order for Lung Cancer Screening with LDCT placed in Epic. Yes (CT Chest Lung Cancer Screening Low Dose W/O CM) [id number] Z12.2-Screening of respiratory organs Z87.891-Personal history of nicotine dependence  I spent 25 minutes of face to face time with Gregory Valencia discussing the risks and benefits of lung cancer screening. We viewed a power point together that explained in detail the above noted topics. We took the time to pause the power point at intervals to allow for questions to be asked and answered to ensure understanding. We discussed that he had taken the single most powerful action possible to decrease his risk of developing lung cancer when he quit smoking. I counseled him to remain smoke free, and to contact me if he ever had the desire to smoke again so that I can provide resources and tools to help support the effort to remain smoke free. We discussed the time and location of the scan, and that either  Doroteo Glassman RN or I will call with the results within  24-48 hours of receiving them. He has my card and contact information in the event he needs to speak with me, in addition to a copy of the  power point we reviewed as a resource. He verbalized understanding of all of the above and had no further questions upon leaving the office.     I explained to the patient that there has been a high incidence of coronary artery  disease noted on these exams. I explained that this is a non-gated exam therefore degree or severity cannot be determined. This patient is not currently on statin therapy. I have asked the patient to follow-up with their PCP regarding any incidental finding of coronary artery disease and management with diet or medication as they feel is clinically indicated. The patient verbalized understanding of the above and had no further questions.     Magdalen Spatz, NP 07/09/2020

## 2020-07-09 NOTE — Patient Instructions (Signed)
Thank you for participating in the [facility]. It was our pleasure to meet you today. We will call you with the results of your scan within the next few days. Your scan will be assigned a Lung RADS category score by the physicians reading the scans.  This Lung RADS score determines follow up scanning.  See below for description of categories, and follow up screening recommendations. We will be in touch to schedule your follow up screening annually or based on recommendations of our providers. We will fax a copy of your scan results to your Primary Care Physician, or the physician who referred you to the program, to ensure they have the results. Please call the office if you have any questions or concerns regarding your scanning experience or results.  Our office number is [phone]. Please speak with Denise Phelps, RN. She is our Lung Cancer Screening RN. If she is unavailable when you call, please have the office staff send her a message. She will return your call at her earliest convenience. Remember, if your scan is normal, we will scan you annually as long as you continue to meet the criteria for the program. (Age 55-77, Current smoker or smoker who has quit within the last 15 years). If you are a smoker, remember, quitting is the single most powerful action that you can take to decrease your risk of lung cancer and other pulmonary, breathing related problems. We know quitting is hard, and we are here to help.  Please let us know if there is anything we can do to help you meet your goal of quitting. If you are a former smoker, congratulations. We are proud of you! Remain smoke free! Remember you can refer friends or family members through the number above.  We will screen them to make sure they meet criteria for the program. Thank you for helping us take better care of you by participating in Lung Screening.  Lung RADS Categories:  Lung RADS 1: no nodules  or definitely non-concerning nodules.  Recommendation is for a repeat annual scan in 12 months.  Lung RADS 2:  nodules that are non-concerning in appearance and behavior with a very low likelihood of becoming an active cancer. Recommendation is for a repeat annual scan in 12 months.  Lung RADS 3: nodules that are probably non-concerning , includes nodules with a low likelihood of becoming an active cancer.  Recommendation is for a 6-month repeat screening scan. Often noted after an upper respiratory illness. We will be in touch to make sure you have no questions, and to schedule your 6-month scan.  Lung RADS 4 A: nodules with concerning findings, recommendation is most often for a follow up scan in 3 months or additional testing based on our provider's assessment of the scan. We will be in touch to make sure you have no questions and to schedule the recommended 3 month follow up scan.  Lung RADS 4 B:  indicates findings that are concerning. We will be in touch with you to schedule additional diagnostic testing based on our provider's  assessment of the scan.   

## 2020-07-10 ENCOUNTER — Ambulatory Visit
Admission: RE | Admit: 2020-07-10 | Discharge: 2020-07-10 | Disposition: A | Discharge status: Home / Self Care | Payer: BC Managed Care – PPO | Source: Ambulatory Visit | Attending: Acute Care | Admitting: Acute Care

## 2020-07-10 ENCOUNTER — Other Ambulatory Visit: Payer: Self-pay

## 2020-07-10 DIAGNOSIS — Z87891 Personal history of nicotine dependence: Secondary | ICD-10-CM | POA: Diagnosis not present

## 2020-07-13 NOTE — Progress Notes (Signed)
Please call patient and let them  know their  low dose Ct was read as a Lung RADS 2: nodules that are benign in appearance and behavior with a very low likelihood of becoming a clinically active cancer due to size or lack of growth. Recommendation per radiology is for a repeat LDCT in 12 months. .Please let them  know we will order and schedule their  annual screening scan for 07/2021. Please place order for annual  screening scan for  07/2021 and fax results to PCP. Thanks so much.

## 2020-07-16 ENCOUNTER — Other Ambulatory Visit: Payer: Self-pay | Admitting: *Deleted

## 2020-07-16 DIAGNOSIS — Z87891 Personal history of nicotine dependence: Secondary | ICD-10-CM

## 2020-08-01 ENCOUNTER — Telehealth: Payer: Self-pay | Admitting: Family Medicine

## 2020-08-01 DIAGNOSIS — R599 Enlarged lymph nodes, unspecified: Secondary | ICD-10-CM

## 2020-08-01 NOTE — Telephone Encounter (Signed)
Due for follow-up CAT scan of his pelvis regarding lymphadenopathy.  I put in the order.  Please let him know we are working on this.  I did not want him to be surprised when he got the phone call.  Thanks.

## 2020-08-02 NOTE — Telephone Encounter (Signed)
Notified patient about f/u CT scan being due. States he is okay doing this as long as it is run thru insurance first. I advised patient that this is something that is usually done before he would be scheduled. He will await the call to scheduled.

## 2020-08-16 ENCOUNTER — Telehealth: Payer: Self-pay

## 2020-08-16 NOTE — Telephone Encounter (Signed)
Tried PA with Dr Damita Dunnings, went for review not meeting clinical requirements.  Dr Damita Dunnings can call for peer to peer  Phone: [phone]-need to call within next 2 days-best today or tomorrow before case is closed.   Member ID #: [id number]  Order ID [id number] for this request  CPT code [phone]  Location [place]

## 2020-08-17 ENCOUNTER — Encounter: Payer: Self-pay | Admitting: Family Medicine

## 2020-08-17 DIAGNOSIS — R590 Localized enlarged lymph nodes: Secondary | ICD-10-CM | POA: Insufficient documentation

## 2020-08-17 NOTE — Telephone Encounter (Signed)
I called.  I provided no additional information.  It was approved.   #[id number] to be done at [facility] site.    There was not point in them denying the PA in the first place.

## 2020-08-17 NOTE — Addendum Note (Signed)
Addended by: Kris Mouton on: 08/17/2020 02:45 PM   Modules accepted: Orders

## 2020-08-17 NOTE — Telephone Encounter (Signed)
Noted. Called and left message for the patient letting him know this information and that someone from [facility] in [place] will call him to schedule. Please see referral/order notes for more information.

## 2020-09-08 DIAGNOSIS — G4733 Obstructive sleep apnea (adult) (pediatric): Secondary | ICD-10-CM | POA: Diagnosis not present

## 2020-10-08 ENCOUNTER — Other Ambulatory Visit: Payer: Self-pay | Admitting: Gastroenterology

## 2020-10-08 DIAGNOSIS — G4733 Obstructive sleep apnea (adult) (pediatric): Secondary | ICD-10-CM | POA: Diagnosis not present

## 2020-11-08 DIAGNOSIS — G4733 Obstructive sleep apnea (adult) (pediatric): Secondary | ICD-10-CM | POA: Diagnosis not present

## 2020-11-19 ENCOUNTER — Telehealth: Payer: Self-pay | Admitting: Gastroenterology

## 2020-11-19 MED ORDER — PANTOPRAZOLE SODIUM 40 MG PO TBEC: DELAYED_RELEASE_TABLET | ORAL | 1 refills | Status: DC

## 2020-11-19 NOTE — Telephone Encounter (Signed)
[place] [facility] [place] He just made a f/u appt on 12/25/20 at 8:30am.

## 2020-11-19 NOTE — Telephone Encounter (Signed)
Prescription sent to patient's pharmacy until scheduled appt. 

## 2020-12-08 DIAGNOSIS — G4733 Obstructive sleep apnea (adult) (pediatric): Secondary | ICD-10-CM | POA: Diagnosis not present

## 2020-12-19 ENCOUNTER — Other Ambulatory Visit: Payer: Self-pay | Admitting: Gastroenterology

## 2020-12-25 ENCOUNTER — Encounter: Payer: Self-pay | Admitting: Gastroenterology

## 2020-12-25 ENCOUNTER — Ambulatory Visit: Payer: BC Managed Care – PPO | Admitting: Gastroenterology

## 2020-12-25 VITALS — BP 100/78 | HR 70 | Ht 74.0 in | Wt 262.0 lb

## 2020-12-25 DIAGNOSIS — K219 Gastro-esophageal reflux disease without esophagitis: Secondary | ICD-10-CM | POA: Diagnosis not present

## 2020-12-25 MED ORDER — PANTOPRAZOLE SODIUM 40 MG PO TBEC: DELAYED_RELEASE_TABLET | ORAL | 11 refills | Status: DC

## 2020-12-25 NOTE — Patient Instructions (Signed)
We have sent the following medications to your pharmacy for you to pick up at your convenience: pantoprazole.   The [facility] providers would like to encourage you to use [facility] to communicate with providers for non-urgent requests or questions.  Due to long hold times on the telephone, sending your provider a message by [facility] may be a faster and more efficient way to get a response.  Please allow 48 business hours for a response.  Please remember that this is for non-urgent requests.   Normal BMI (Body Mass Index- based on height and weight) is between 19 and 25. Your BMI today is Body mass index is 33.64 kg/m. Marland Kitchen Please consider follow up  regarding your BMI with your Primary Care Provider.  Thank you for choosing me and [facility].  Pricilla Riffle. Dagoberto Ligas., MD., Marval Regal

## 2020-12-25 NOTE — Progress Notes (Signed)
    History of Present Illness: This is a 55 year old male with GERD.  He states his reflux symptoms are well controlled on daily pantoprazole and following antireflux measures.  He has no gastrointestinal complaints.  He relates that he had a kidney stone last summer and he is attempting to drink more water and less sodas.  Current Medications, Allergies, Past Medical History, Past Surgical History, Family History and Social History were reviewed in [employer] record.   Physical Exam: General: Well developed, well nourished, no acute distress Head: Normocephalic and atraumatic Eyes: Sclerae anicteric, EOMI Ears: Normal auditory acuity Mouth: Not examined, mask on during Covid-19 pandemic Lungs: Clear throughout to auscultation Heart: Regular rate and rhythm; no murmurs, rubs or bruits Abdomen: Soft, non tender and non distended. No masses, hepatosplenomegaly or hernias noted. Normal Bowel sounds Rectal: Not done Musculoskeletal: Symmetrical with no gross deformities  Pulses:  Normal pulses noted Extremities: No clubbing, cyanosis, edema or deformities noted Neurological: Alert oriented x 4, grossly nonfocal Psychological:  Alert and cooperative. Normal mood and affect   Assessment and Recommendations:  GERD.  Follow antireflux measures.  Continue pantoprazole 40 mg daily. 2.   CRC screening, average risk.  A 10-year interval colonoscopy is recommended in January 2026.

## 2021-01-02 ENCOUNTER — Other Ambulatory Visit: Payer: Self-pay | Admitting: *Deleted

## 2021-01-02 DIAGNOSIS — N200 Calculus of kidney: Secondary | ICD-10-CM

## 2021-01-03 ENCOUNTER — Ambulatory Visit: Payer: BC Managed Care – PPO | Admitting: Urology

## 2021-01-03 ENCOUNTER — Ambulatory Visit
Admission: RE | Admit: 2021-01-03 | Discharge: 2021-01-03 | Disposition: A | Discharge status: Home / Self Care | Payer: BC Managed Care – PPO | Attending: Urology | Admitting: Urology

## 2021-01-03 ENCOUNTER — Ambulatory Visit
Admission: RE | Admit: 2021-01-03 | Discharge: 2021-01-03 | Disposition: A | Discharge status: Home / Self Care | Payer: BC Managed Care – PPO | Source: Ambulatory Visit | Attending: Urology | Admitting: Urology

## 2021-01-03 ENCOUNTER — Encounter: Payer: Self-pay | Admitting: Urology

## 2021-01-03 ENCOUNTER — Other Ambulatory Visit: Payer: Self-pay

## 2021-01-03 VITALS — BP 121/72 | HR 61 | Ht 74.0 in | Wt 260.0 lb

## 2021-01-03 DIAGNOSIS — N200 Calculus of kidney: Secondary | ICD-10-CM

## 2021-01-03 DIAGNOSIS — Z125 Encounter for screening for malignant neoplasm of prostate: Secondary | ICD-10-CM

## 2021-01-03 NOTE — Progress Notes (Signed)
   01/03/2021 3:17 PM   Gregory Valencia Jul 22, 1965 [id number]  Reason for visit: Follow up nephrolithiasis, PSA screening  HPI: 55 year old male who originally presented in April 2021 with gross hematuria and was ultimately found to have a 7 mm asymptomatic left distal ureteral stone.  He ultimately underwent ureteroscopy in June 2021 with stone removal.  PSA was normal at that time at 2.0.  He denies any gross hematuria or stone events since our last visit.  I personally viewed and interpreted his KUB today that shows no evidence of stone disease.  I also reviewed the CT from August 2021 that shows no hydronephrosis or nephrolithiasis.  We reviewed the risks and benefits of PSA screening, and reviewed the AUA guidelines that recommend screening every 1 to 2 years men ages 59-70.  He would like to continue PSA screening, and we will draw this today and call with those results.  Call with PSA results, if normal can continue PSA screening with PCP  [place], [place] [address], [place], [place] [address] [phone]

## 2021-01-03 NOTE — Patient Instructions (Signed)
Textbook of Natural Medicine (5th ed., pp. 1518-1527.e3). [place], [place]: Elsevier.">  Dietary Guidelines to Help Prevent Kidney Stones Kidney stones are deposits of minerals and salts that form inside your kidneys. Your risk of developing kidney stones may be greater depending on your diet, your lifestyle, the medicines you take, and whether you have certain medical conditions. Most people can lower their chances of developing kidney stones by following the instructions below. Your dietitian may give you more specific instructions depending on your overall health and the type of kidney stones youtend to develop. What are tips for following this plan? Reading food labels  Choose foods with "no salt added" or "low-salt" labels. Limit your salt (sodium) intake to less than 1,500 mg a day. Choose foods with calcium for each meal and snack. Try to eat about 300 mg of calcium at each meal. Foods that contain 200-500 mg of calcium a serving include: 8 oz (237 mL) of milk, calcium-fortifiednon-dairy milk, and calcium-fortifiedfruit juice. Calcium-fortified means that calcium has been added to these drinks. 8 oz (237 mL) of kefir, yogurt, and soy yogurt. 4 oz (114 g) of tofu. 1 oz (28 g) of cheese. 1 cup (150 g) of dried figs. 1 cup (91 g) of cooked broccoli. One 3 oz (85 g) can of sardines or mackerel. Most people need 1,000-1,500 mg of calcium a day. Talk to your dietitian abouthow much calcium is recommended for you. Shopping Buy plenty of fresh fruits and vegetables. Most people do not need to avoid fruits and vegetables, even if these foods contain nutrients that may contribute to kidney stones. When shopping for convenience foods, choose: Whole pieces of fruit. Pre-made salads with dressing on the side. Low-fat fruit and yogurt smoothies. Avoid buying frozen meals or prepared deli foods. These can be high in sodium. Look for foods with live cultures, such as yogurt and kefir. Choose high-fiber  grains, such as whole-wheat breads, oat bran, and wheat cereals. Cooking Do not add salt to food when cooking. Place a salt shaker on the table and allow each person to add his or her own salt to taste. Use vegetable protein, such as beans, textured vegetable protein (TVP), or tofu, instead of meat in pasta, casseroles, and soups. Meal planning Eat less salt, if told by your dietitian. To do this: Avoid eating processed or pre-made food. Avoid eating fast food. Eat less animal protein, including cheese, meat, poultry, or fish, if told by your dietitian. To do this: Limit the number of times you have meat, poultry, fish, or cheese each week. Eat a diet free of meat at least 2 days a week. Eat only one serving each day of meat, poultry, fish, or seafood. When you prepare animal protein, cut pieces into small portion sizes. For most meat and fish, one serving is about the size of the palm of your hand. Eat at least five servings of fresh fruits and vegetables each day. To do this: Keep fruits and vegetables on hand for snacks. Eat one piece of fruit or a handful of berries with breakfast. Have a salad and fruit at lunch. Have two kinds of vegetables at dinner. Limit foods that are high in a substance called oxalate. These include: Spinach (cooked), rhubarb, beets, sweet potatoes, and Swiss chard. Peanuts. Potato chips, french fries, and baked potatoes with skin on. Nuts and nut products. Chocolate. If you regularly take a diuretic medicine, make sure to eat at least 1 or 2 servings of fruits or vegetables that are   high in potassium each day. These include: Avocado. Banana. Orange, prune, carrot, or tomato juice. Baked potato. Cabbage. Beans and split peas. Lifestyle  Drink enough fluid to keep your urine pale yellow. This is the most important thing you can do. Spread your fluid intake throughout the day. If you drink alcohol: Limit how much you use to: 0-1 drink a day for women who  are not pregnant. 0-2 drinks a day for men. Be aware of how much alcohol is in your drink. In the [place] one drink equals one 12 oz bottle of beer (355 mL), one 5 oz glass of wine (148 mL), or one 1 oz glass of hard liquor (44 mL). Lose weight if told by your health care provider. Work with your dietitian to find an eating plan and weight loss strategies that work best for you.  General information Talk to your health care provider and dietitian about taking daily supplements. You may be told the following depending on your health and the cause of your kidney stones: Not to take supplements with vitamin C. To take a calcium supplement. To take a daily probiotic supplement. To take other supplements such as magnesium, fish oil, or vitamin B6. Take over-the-counter and prescription medicines only as told by your health care provider. These include supplements. What foods should I limit? Limit your intake of the following foods, or eat them as told by your dietitian. Vegetables Spinach. Rhubarb. Beets. Canned vegetables. Gregory Valencia. Olives. Baked potatoeswith skin. Grains Wheat bran. Baked goods. Salted crackers. Cereals high in sugar. Meats and other proteins Nuts. Nut butters. Large portions of meat, poultry, or fish. Salted, precooked,or cured meats, such as sausages, meat loaves, and hot dogs. Dairy Cheese. Beverages Regular soft drinks. Regular vegetable juice. Seasonings and condiments Seasoning blends with salt. Salad dressings. Soy sauce. Ketchup. Barbecue sauce. Other foods Canned soups. Canned pasta sauce. Casseroles. Pizza. Lasagna. Frozen meals.Potato chips. [place] fries. The items listed above may not be a complete list of foods and beverages you should limit. Contact a dietitian for more information. What foods should I avoid? Talk to your dietitian about specific foods you should avoid based on the typeof kidney stones you have and your overall health. Fruits Grapefruit. The  item listed above may not be a complete list of foods and beverages you should avoid. Contact a dietitian for more information. Summary Kidney stones are deposits of minerals and salts that form inside your kidneys. You can lower your risk of kidney stones by making changes to your diet. The most important thing you can do is drink enough fluid. Drink enough fluid to keep your urine pale yellow. Talk to your dietitian about how much calcium you should have each day, and eat less salt and animal protein as told by your dietitian. This information is not intended to replace advice given to you by your health care provider. Make sure you discuss any questions you have with your healthcare provider. Document Revised: 05/12/2019 Document Reviewed: 05/12/2019 Elsevier Patient Education  2022 [employer]. Prostate Cancer Screening  Prostate cancer screening is a test that is done to check for the presence of prostate cancer in men. The prostate gland is a walnut-sized gland that is located below the bladder and in front of the rectum in males. The function of the prostate is to add fluid to semen during ejaculation. Prostate cancer isthe second most common type of cancer in men. Who should have prostate cancer screening?  Screening recommendations vary based on age  and other risk factors. Screening is recommended if: You are older than age 81. If you are age 66-69, talk with your health care provider about your need for screening and how often screening should be done. Because most prostate cancers are slow growing and will not cause death, screening is generally reserved in this age group for men who have a 10-15-year life expectancy. You are younger than age 75, and you have these risk factors: Being a black male or a male of African descent. Having a father, brother, or uncle who has been diagnosed with prostate cancer. The risk is higher if your family member's cancer occurred at an early  age. Screening is not recommended if: You are younger than age 67. You are between the ages of 75 and 57 and you have no risk factors. You are 29 years of age or older. At this age, the risks that screening can cause are greater than the benefits that it may provide. If you are at high risk for prostate cancer, your health care provider may recommend that you have screenings more often or that you start screening at ayounger age. How is screening for prostate cancer done? The recommended prostate cancer screening test is a blood test called the prostate-specific antigen (PSA) test. PSA is a protein that is made in the prostate. As you age, your prostate naturally produces more PSA. Abnormally high PSA levels may be caused by: Prostate cancer. An enlarged prostate that is not caused by cancer (benign prostatic hyperplasia, BPH). This condition is very common in older men. A prostate gland infection (prostatitis). Depending on the PSA results, you may need more tests, such as: A physical exam to check the size of your prostate gland. Blood and imaging tests. A procedure to remove tissue samples from your prostate gland for testing (biopsy). What are the benefits of prostate cancer screening? Screening can help to identify cancer at an early stage, before symptoms start and when the cancer can be treated more easily. There is a small chance that screening may lower your risk of dying from prostate cancer. The chance is small because prostate cancer is a slow-growing cancer, and most men with prostate cancer die from a different cause. What are the risks of prostate cancer screening? The main risk of prostate cancer screening is diagnosing and treating prostate cancer that would never have caused any symptoms or problems. This is called overdiagnosisand overtreatment. PSA screening cannot tell you if your PSA is high due to cancer or a different cause. A prostate biopsy is the only procedure to  diagnose prostate cancer. Even the results of a biopsy may not tell you if your cancer needs to be treated. Slow-growing prostate cancer may not need any treatment other than monitoring, so diagnosing and treating it may causeunnecessary stress or other side effects. A prostate biopsy may also cause: Infection or fever. A false negative. This is a result that shows that you do not have prostate cancer when you actually do have prostate cancer. Questions to ask your health care provider When should I start prostate cancer screening? What is my risk for prostate cancer? How often do I need screening? What type of screening tests do I need? How do I get my test results? What do my results mean? Do I need treatment? Where to find more information The American Cancer Society: www.cancer.org American Urological Association: www.auanet.org Contact a health care provider if: You have difficulty urinating. You have pain when you urinate or  ejaculate. You have blood in your urine or semen. You have pain in your back or in the area of your prostate. Summary Prostate cancer is a common type of cancer in men. The prostate gland is located below the bladder and in front of the rectum. This gland adds fluid to semen during ejaculation. Prostate cancer screening may identify cancer at an early stage, when the cancer can be treated more easily. The prostate-specific antigen (PSA) test is the recommended screening test for prostate cancer. Discuss the risks and benefits of prostate cancer screening with your health care provider. If you are age 22 or older, the risks that screening can cause are greater than the benefits that it may provide. This information is not intended to replace advice given to you by your health care provider. Make sure you discuss any questions you have with your healthcare provider. Document Revised: 05/17/2020 Document Reviewed: 12/30/2018 Elsevier Patient Education  [place].

## 2021-01-04 LAB — PSA TOTAL (REFLEX TO FREE): Prostate Specific Ag, Serum: 2 ng/mL (ref 0.0–4.0)

## 2021-01-08 DIAGNOSIS — G4733 Obstructive sleep apnea (adult) (pediatric): Secondary | ICD-10-CM | POA: Diagnosis not present

## 2021-02-08 DIAGNOSIS — G4733 Obstructive sleep apnea (adult) (pediatric): Secondary | ICD-10-CM | POA: Diagnosis not present

## 2021-03-10 DIAGNOSIS — G4733 Obstructive sleep apnea (adult) (pediatric): Secondary | ICD-10-CM | POA: Diagnosis not present

## 2021-03-13 DIAGNOSIS — G4733 Obstructive sleep apnea (adult) (pediatric): Secondary | ICD-10-CM | POA: Diagnosis not present

## 2021-04-10 DIAGNOSIS — G4733 Obstructive sleep apnea (adult) (pediatric): Secondary | ICD-10-CM | POA: Diagnosis not present

## 2021-04-13 DIAGNOSIS — G4733 Obstructive sleep apnea (adult) (pediatric): Secondary | ICD-10-CM | POA: Diagnosis not present

## 2021-05-10 DIAGNOSIS — G4733 Obstructive sleep apnea (adult) (pediatric): Secondary | ICD-10-CM | POA: Diagnosis not present

## 2021-05-13 DIAGNOSIS — G4733 Obstructive sleep apnea (adult) (pediatric): Secondary | ICD-10-CM | POA: Diagnosis not present

## 2021-06-10 DIAGNOSIS — G4733 Obstructive sleep apnea (adult) (pediatric): Secondary | ICD-10-CM | POA: Diagnosis not present

## 2021-07-09 ENCOUNTER — Encounter: Payer: Self-pay | Admitting: Dermatology

## 2021-07-09 ENCOUNTER — Other Ambulatory Visit: Payer: Self-pay

## 2021-07-09 ENCOUNTER — Ambulatory Visit: Payer: BC Managed Care – PPO | Admitting: Dermatology

## 2021-07-09 DIAGNOSIS — L821 Other seborrheic keratosis: Secondary | ICD-10-CM

## 2021-07-09 DIAGNOSIS — L738 Other specified follicular disorders: Secondary | ICD-10-CM

## 2021-07-09 DIAGNOSIS — L918 Other hypertrophic disorders of the skin: Secondary | ICD-10-CM

## 2021-07-09 DIAGNOSIS — L570 Actinic keratosis: Secondary | ICD-10-CM | POA: Diagnosis not present

## 2021-07-09 DIAGNOSIS — L578 Other skin changes due to chronic exposure to nonionizing radiation: Secondary | ICD-10-CM | POA: Diagnosis not present

## 2021-07-09 DIAGNOSIS — Z1283 Encounter for screening for malignant neoplasm of skin: Secondary | ICD-10-CM

## 2021-07-09 DIAGNOSIS — L814 Other melanin hyperpigmentation: Secondary | ICD-10-CM

## 2021-07-09 DIAGNOSIS — Z85828 Personal history of other malignant neoplasm of skin: Secondary | ICD-10-CM | POA: Diagnosis not present

## 2021-07-09 DIAGNOSIS — L739 Follicular disorder, unspecified: Secondary | ICD-10-CM

## 2021-07-09 DIAGNOSIS — D1800 Hemangioma unspecified site: Secondary | ICD-10-CM

## 2021-07-09 DIAGNOSIS — D229 Melanocytic nevi, unspecified: Secondary | ICD-10-CM

## 2021-07-09 NOTE — Patient Instructions (Addendum)
Recommend starting moisturizer with exfoliant (Urea, Salicylic acid, or Lactic acid) one to two times daily to help smooth rough and bumpy skin.  OTC options include Cetaphil Rough and Bumpy lotion (Urea), Eucerin Roughness Relief lotion or spot treatment cream (Urea), CeraVe SA lotion/cream for Rough and Bumpy skin (Sal Acid), Gold Bond Rough and Bumpy cream (Sal Acid), and AmLactin 12% lotion/cream (Lactic Acid).  If applying in morning, also apply sunscreen to sun-exposed areas, since these exfoliating moisturizers can increase sensitivity to sun.  Seborrheic Keratosis  What causes seborrheic keratoses? Seborrheic keratoses are harmless, common skin growths that first appear during adult life.  As time goes by, more growths appear.  Some people may develop a large number of them.  Seborrheic keratoses appear on both covered and uncovered body parts.  They are not caused by sunlight.  The tendency to develop seborrheic keratoses can be inherited.  They vary in color from skin-colored to gray, brown, or even black.  They can be either smooth or have a rough, warty surface.   Seborrheic keratoses are superficial and look as if they were stuck on the skin.  Under the microscope this type of keratosis looks like layers upon layers of skin.  That is why at times the top layer may seem to fall off, but the rest of the growth remains and re-grows.    Treatment Seborrheic keratoses do not need to be treated, but can easily be removed in the office.  Seborrheic keratoses often cause symptoms when they rub on clothing or jewelry.  Lesions can be in the way of shaving.  If they become inflamed, they can cause itching, soreness, or burning.  Removal of a seborrheic keratosis can be accomplished by freezing, burning, or surgery. If any spot bleeds, scabs, or grows rapidly, please return to have it checked, as these can be an indication of a skin cancer.  Cryotherapy Aftercare  Wash gently with soap and water  everyday.   Apply Vaseline and Band-Aid daily until healed.   If You Need Anything After Your Visit  If you have any questions or concerns for your doctor, please call our main line at [phone] and press option 4 to reach your doctor's medical assistant. If no one answers, please leave a voicemail as directed and we will return your call as soon as possible. Messages left after 4 pm will be answered the following business day.   You may also send [place] a message via [place]. We typically respond to MyChart messages within 1-2 business days.  For prescription refills, please ask your pharmacy to contact our office. Our fax number is [phone].  If you have an urgent issue when the clinic is closed that cannot wait until the next business day, you can page your doctor at the number below.    Please note that while we do our best to be available for urgent issues outside of office hours, we are not available 24/7.   If you have an urgent issue and are unable to reach [place], you may choose to seek medical care at your doctor's office, retail clinic, urgent care center, or emergency room.  If you have a medical emergency, please immediately call 911 or go to the emergency department.  Pager Numbers  - Dr. Nehemiah Massed: [phone]  - Dr. Laurence Ferrari: [phone]  - Dr. Nicole Kindred: [phone]  In the event of inclement weather, please call our main line at [phone] for an update on the status of any delays or closures.  Dermatology Medication Tips: Please keep the boxes that topical medications come in in order to help keep track of the instructions about where and how to use these. Pharmacies typically print the medication instructions only on the boxes and not directly on the medication tubes.   If your medication is too expensive, please contact our office at [phone] option 4 or send [place] a message through [place].   We are unable to tell what your co-pay for medications will be in  advance as this is different depending on your insurance coverage. However, we may be able to find a substitute medication at lower cost or fill out paperwork to get insurance to cover a needed medication.   If a prior authorization is required to get your medication covered by your insurance company, please allow [place] 1-2 business days to complete this process.  Drug prices often vary depending on where the prescription is filled and some pharmacies may offer cheaper prices.  The website www.goodrx.com contains coupons for medications through different pharmacies. The prices here do not account for what the cost may be with help from insurance (it may be cheaper with your insurance), but the website can give you the price if you did not use any insurance.  - You can print the associated coupon and take it with your prescription to the pharmacy.  - You may also stop by our office during regular business hours and pick up a GoodRx coupon card.  - If you need your prescription sent electronically to a different pharmacy, notify our office through [facility] or by phone at [phone] option 4.     Si Usted Necesita Algo Despus de Su Visita  Tambin puede enviarnos un mensaje a travs de Pharmacist, community. Por lo general respondemos a los mensajes de MyChart en el transcurso de 1 a 2 das hbiles.  Para renovar recetas, por favor pida a su farmacia que se ponga en contacto con nuestra oficina. Harland Dingwall de fax es [place] [phone].  Si tiene un asunto urgente cuando la clnica est cerrada y que no puede esperar hasta el siguiente da hbil, puede llamar/localizar a su doctor(a) al nmero que aparece a continuacin.   Por favor, tenga en cuenta que aunque hacemos todo lo posible para estar disponibles para asuntos urgentes fuera del horario de [place], no estamos disponibles las 24 horas del da, los 7 das de la [place].   Si tiene un problema urgente y no puede comunicarse con nosotros, puede  optar por buscar atencin mdica  en el consultorio de su doctor(a), en una clnica privada, en un centro de atencin urgente o en una sala de emergencias.  Si tiene Engineering geologist, por favor llame inmediatamente al 911 o vaya a la sala de emergencias.  Nmeros de bper  - Dr. Nehemiah Massed: [phone]  - Dra. Moye: [phone]  - Dra. Nicole Kindred: [phone]  En caso de inclemencias del [place], por favor llame a Johnsie Kindred principal al [phone] para una actualizacin sobre el [place] de cualquier retraso o cierre.  Consejos para la medicacin en dermatologa: Por favor, guarde las cajas en las que vienen los medicamentos de uso tpico para ayudarle a seguir las instrucciones sobre dnde y cmo usarlos. Las farmacias generalmente imprimen las instrucciones del medicamento slo en las cajas y no directamente en los tubos del [place].   Si su medicamento es muy caro, por favor, pngase en contacto con Zigmund Daniel llamando al [phone] y presione la opcin 4 o envenos un  mensaje a travs de MyChart.   No podemos decirle cul ser su copago por los medicamentos por adelantado ya que esto es diferente dependiendo de la cobertura de su seguro. Sin embargo, es posible que podamos encontrar un medicamento sustituto a Electrical engineer un formulario para que el seguro cubra el medicamento que se considera necesario.   Si se requiere una autorizacin previa para que su compaa de seguros [place] su medicamento, por favor permtanos de 1 a 2 das hbiles para completar este proceso.  Los precios de los medicamentos varan con frecuencia dependiendo del Environmental consultant de dnde se surte la receta y alguna farmacias pueden ofrecer precios ms baratos.  El sitio web www.goodrx.com tiene cupones para medicamentos de Airline pilot. Los precios aqu no tienen en cuenta lo que podra costar con la ayuda del seguro (puede ser ms barato con su seguro), pero el sitio web puede darle el  precio si no utiliz Research scientist (physical sciences).  - Puede imprimir el cupn correspondiente y llevarlo con su receta a la farmacia.  - Tambin puede pasar por nuestra oficina durante el horario de atencin regular y Charity fundraiser una tarjeta de cupones de GoodRx.  - Si necesita que su receta se enve electrnicamente a una farmacia diferente, informe a nuestra oficina a travs de MyChart de [place] o por telfono llamando al [phone] y presione la opcin 4.

## 2021-07-09 NOTE — Progress Notes (Signed)
Follow-Up Visit   Subjective  Gregory Valencia is a 56 y.o. male who presents for the following: Annual Exam.  The patient presents for Total-Body Skin Exam (TBSE) for skin cancer screening and mole check.  The patient has spots, moles and lesions to be evaluated, some may be new or changing. He has a history of SCC of the left lateral thigh and a history of AKs. He has skin tags of the bilateral axilla and upper inner thighs that he would like removed that get irritated.     The following portions of the chart were reviewed this encounter and updated as appropriate:       Review of Systems:  No other skin or systemic complaints except as noted in HPI or Assessment and Plan.  Objective  Well appearing patient in no apparent distress; mood and affect are within normal limits.  A focused examination was performed including all skin waist up and legs. Relevant physical exam findings are noted in the Assessment and Plan.  Back Pink follicular papules   R forearm x 3, L forearm x 2, L med thigh x 2 (7) Keratotic papules  Scalp (11) Pink scaly macules    Assessment & Plan  Skin cancer screening performed today. Actinic Damage - chronic, secondary to cumulative UV radiation exposure/sun exposure over time - diffuse scaly erythematous macules with underlying dyspigmentation - Recommend daily broad spectrum sunscreen SPF 30+ to sun-exposed areas, reapply every 2 hours as needed.  - Recommend staying in the shade or wearing long sleeves, sun glasses (UVA+UVB protection) and wide brim hats (4-inch brim around the entire circumference of the hat). - Call for new or changing lesions.  History of Squamous Cell Carcinoma of the Skin - No evidence of recurrence today of the left lateral thigh - Recommend regular full body skin exams - Recommend daily broad spectrum sunscreen SPF 30+ to sun-exposed areas, reapply every 2 hours as needed.  - Call if any new or changing lesions are noted  between office visits  Lentigines - Scattered tan macules - Due to sun exposure - Benign-appering, observe - Recommend daily broad spectrum sunscreen SPF 30+ to sun-exposed areas, reapply every 2 hours as needed. - Call for any changes  Seborrheic Keratoses - Stuck-on, waxy, tan-brown papules and/or plaques  - Benign-appearing - Discussed benign etiology and prognosis. - Observe - Call for any changes  Sebaceous Hyperplasia - Small yellow papules with a central dell - Benign - Observe  Acrochordons (Skin Tags) - Removal desired by patient - Fleshy, skin-colored pedunculated papules - Benign appearing.  - Patient desires removal. Reviewed that this is not covered by insurance and they will be charged a cosmetic fee for removal. Patient signed non-covered consent.  - Prior to the procedure, reviewed the expected small wound. Also reviewed the risk of leaving a small scar and the small risk of infection.  PROCEDURE - The areas were prepped with isopropyl alcohol. A small amount of lidocaine 1% with epinephrine was injected at the base of each lesion to achieve good local anesthesia. The skin tags were removed using a snip technique. Aluminum chloride was used for hemostasis. Petrolatum and a bandage were applied. The procedure was tolerated well. - Wound care was reviewed with the patient. They were advised to call with any concerns. Total number of treated acrochordons 8  R med thigh x 3, L med thigh x 2, L axilla x 1, R axilla x 1, R inguinal crease x 1  Folliculitis  Back  Mild Recommend OTC antibacterial soap in the shower. If worsens, will send Rx.   Hypertrophic actinic keratosis (7) R forearm x 3, L forearm x 2, L med thigh x 2  vs ISKs  Actinic keratoses are precancerous spots that appear secondary to cumulative UV radiation exposure/sun exposure over time. They are chronic with expected duration over 1 year. A portion of actinic keratoses will progress to squamous cell  carcinoma of the skin. It is not possible to reliably predict which spots will progress to skin cancer and so treatment is recommended to prevent development of skin cancer.  Recommend daily broad spectrum sunscreen SPF 30+ to sun-exposed areas, reapply every 2 hours as needed.  Recommend staying in the shade or wearing long sleeves, sun glasses (UVA+UVB protection) and wide brim hats (4-inch brim around the entire circumference of the hat). Call for new or changing lesions.  Destruction of lesion - R forearm x 3, L forearm x 2, L med thigh x 2  Destruction method: cryotherapy   Informed consent: discussed and consent obtained   Lesion destroyed using liquid nitrogen: Yes   Region frozen until ice ball extended beyond lesion: Yes   Outcome: patient tolerated procedure well with no complications   Post-procedure details: wound care instructions given   Additional details:  Prior to procedure, discussed risks of blister formation, small wound, skin dyspigmentation, or rare scar following cryotherapy. Recommend Vaseline ointment to treated areas while healing.   AK (actinic keratosis) (11) Scalp  Actinic keratoses are precancerous spots that appear secondary to cumulative UV radiation exposure/sun exposure over time. They are chronic with expected duration over 1 year. A portion of actinic keratoses will progress to squamous cell carcinoma of the skin. It is not possible to reliably predict which spots will progress to skin cancer and so treatment is recommended to prevent development of skin cancer.  Recommend daily broad spectrum sunscreen SPF 30+ to sun-exposed areas, reapply every 2 hours as needed.  Recommend staying in the shade or wearing long sleeves, sun glasses (UVA+UVB protection) and wide brim hats (4-inch brim around the entire circumference of the hat). Call for new or changing lesions.  Destruction of lesion - Scalp  Destruction method: cryotherapy   Informed consent:  discussed and consent obtained   Lesion destroyed using liquid nitrogen: Yes   Region frozen until ice ball extended beyond lesion: Yes   Outcome: patient tolerated procedure well with no complications   Post-procedure details: wound care instructions given   Additional details:  Prior to procedure, discussed risks of blister formation, small wound, skin dyspigmentation, or rare scar following cryotherapy. Recommend Vaseline ointment to treated areas while healing.   Melanocytic Nevi - Tan-brown and/or pink-flesh-colored symmetric macules and papules - Benign appearing on exam today - Observation - Call clinic for new or changing moles - Recommend daily use of broad spectrum spf 30+ sunscreen to sun-exposed areas.   Hemangiomas - Red papules - Discussed benign nature - Observe - Call for any changes  Return in about 1 year (around 07/09/2022) for TBSE, Hx SCC.  IJamesetta Orleans, CMA, am acting as scribe for Brendolyn Patty, MD .  Documentation: I have reviewed the above documentation for accuracy and completeness, and I agree with the above.  Brendolyn Patty MD

## 2021-08-16 ENCOUNTER — Other Ambulatory Visit: Payer: Self-pay | Admitting: *Deleted

## 2021-08-19 ENCOUNTER — Telehealth: Payer: Self-pay

## 2021-08-19 NOTE — Telephone Encounter (Signed)
Dr. Damita Dunnings would like patient scheduled for CPE later this spring. Please call to schedule appt.  ?

## 2021-09-10 ENCOUNTER — Encounter: Payer: Self-pay | Admitting: Family Medicine

## 2021-09-10 ENCOUNTER — Ambulatory Visit (INDEPENDENT_AMBULATORY_CARE_PROVIDER_SITE_OTHER): Payer: BC Managed Care – PPO | Admitting: Family Medicine

## 2021-09-10 VITALS — BP 118/78 | HR 75 | Temp 97.9°F | Ht 73.0 in | Wt 265.0 lb

## 2021-09-10 DIAGNOSIS — E7849 Other hyperlipidemia: Secondary | ICD-10-CM | POA: Diagnosis not present

## 2021-09-10 DIAGNOSIS — Z0000 Encounter for general adult medical examination without abnormal findings: Secondary | ICD-10-CM | POA: Diagnosis not present

## 2021-09-10 DIAGNOSIS — Z23 Encounter for immunization: Secondary | ICD-10-CM | POA: Diagnosis not present

## 2021-09-10 DIAGNOSIS — Z125 Encounter for screening for malignant neoplasm of prostate: Secondary | ICD-10-CM | POA: Diagnosis not present

## 2021-09-10 DIAGNOSIS — Z7189 Other specified counseling: Secondary | ICD-10-CM

## 2021-09-10 DIAGNOSIS — K219 Gastro-esophageal reflux disease without esophagitis: Secondary | ICD-10-CM

## 2021-09-10 MED ORDER — PANTOPRAZOLE SODIUM 40 MG PO TBEC: DELAYED_RELEASE_TABLET | ORAL | 3 refills | Status: DC

## 2021-09-10 NOTE — Progress Notes (Signed)
CPE- See plan.  Routine anticipatory guidance given to patient.  See health maintenance.  The possibility exists that previously documented standard health maintenance information may have been brought forward from a previous encounter into this note.  If needed, that same information has been updated to reflect the current situation based on today's encounter.   ? ?Tetanus 2023 ?Flu encouraged.  ?PNA not due ?Shingles d/w pt.   ?Covid vaccine encouraged.  D/w pt.   ?Living will d/w pt.  Wife designated if patient were incapacitated.   ?Diet and exercise d/w pt.   ?Colonoscopy 2016 ?PSA pending 2023 ?[facility]   ?Labs pending given his FH.  h/o HLD noted.  D/w pt.   ? ?GERD controlled with PPI.  No ADE on med.  Failed taper.   ? ?D/w pt about urology follow up.   ? ?He had reassuring lung CT 2022.   ? ?Sleeping better with CPAP, compliant.   ? ?He couldn't get coverage for f/u CT abd/pelvis previously.  He is not having any abdominal symptoms.  No fevers chills or weight loss.  No blood in urine or stool.  At this point it likely makes sense to defer the imaging.  Discussed. ? ?PMH and SH reviewed ? ?Meds, vitals, and allergies reviewed.  ? ?ROS: Per HPI.  Unless specifically indicated otherwise in HPI, the patient denies: ? ?General: fever. ?Eyes: acute vision changes ?ENT: sore throat ?Cardiovascular: chest pain ?Respiratory: SOB ?GI: vomiting ?GU: dysuria ?Musculoskeletal: acute back pain ?Derm: acute rash ?Neuro: acute motor dysfunction ?Psych: worsening mood ?Endocrine: polydipsia ?Heme: bleeding ?Allergy: hayfever ? ?GEN: nad, alert and oriented ?HEENT: ncat ?NECK: supple w/o LA ?CV: rrr. ?PULM: ctab, no inc wob ?ABD: soft, +bs ?EXT: no edema ?SKIN: Well-perfused. ?

## 2021-09-10 NOTE — Patient Instructions (Signed)
Tetanus shot today.  ?Go to the lab on the way out.   If you have mychart we'll likely use that to update you.    ?Take care.  Glad to see you. ?

## 2021-09-11 LAB — COMPREHENSIVE METABOLIC PANEL
ALT: 28 U/L (ref 0–53)
AST: 23 U/L (ref 0–37)
Albumin: 4.5 g/dL (ref 3.5–5.2)
Alkaline Phosphatase: 68 U/L (ref 39–117)
BUN: 14 mg/dL (ref 6–23)
CO2: 31 mEq/L (ref 19–32)
Calcium: 10 mg/dL (ref 8.4–10.5)
Chloride: 101 mEq/L (ref 96–112)
Creatinine, Ser: 1.65 mg/dL — ABNORMAL HIGH (ref 0.40–1.50)
GFR: 45.8 mL/min — ABNORMAL LOW (ref >60.00)
Glucose, Bld: 65 mg/dL — ABNORMAL LOW (ref 70–99)
Potassium: 4.4 mEq/L (ref 3.5–5.1)
Sodium: 139 mEq/L (ref 135–145)
Total Bilirubin: 0.5 mg/dL (ref 0.2–1.2)
Total Protein: 7.3 g/dL (ref 6.0–8.3)

## 2021-09-11 LAB — LIPID PANEL
Cholesterol: 198 mg/dL (ref 0–200)
HDL: 38.1 mg/dL — ABNORMAL LOW (ref >39.00)
NonHDL: 160.35
Total CHOL/HDL Ratio: 5
Triglycerides: 366 mg/dL — ABNORMAL HIGH (ref 0.0–149.0)
VLDL: 73.2 mg/dL — ABNORMAL HIGH (ref 0.0–40.0)

## 2021-09-11 LAB — LDL CHOLESTEROL, DIRECT: Direct LDL: 113 mg/dL

## 2021-09-11 LAB — PSA: PSA: 2.23 ng/mL (ref 0.10–4.00)

## 2021-09-11 NOTE — Assessment & Plan Note (Signed)
?  GERD controlled with PPI.  No ADE on med.  Failed taper.   Would continue pantoprazole as is. ?

## 2021-09-11 NOTE — Assessment & Plan Note (Signed)
Tetanus 2023 ?Flu encouraged.  ?PNA not due ?Shingles d/w pt.   ?Covid vaccine encouraged.  D/w pt.   ?Living will d/w pt.  Wife designated if patient were incapacitated.   ?Diet and exercise d/w pt.   ?Colonoscopy 2016 ?PSA pending 2023 ?[facility]   ?Labs pending given his FH.  h/o HLD noted.  D/w pt.   ?

## 2021-09-11 NOTE — Assessment & Plan Note (Signed)
Living will d/w pt.  Wife designated if patient were incapacitated.   ?

## 2021-09-15 ENCOUNTER — Other Ambulatory Visit: Payer: Self-pay | Admitting: Family Medicine

## 2021-09-15 DIAGNOSIS — R7989 Other specified abnormal findings of blood chemistry: Secondary | ICD-10-CM

## 2021-09-24 ENCOUNTER — Other Ambulatory Visit (INDEPENDENT_AMBULATORY_CARE_PROVIDER_SITE_OTHER): Payer: BC Managed Care – PPO

## 2021-09-24 DIAGNOSIS — R7989 Other specified abnormal findings of blood chemistry: Secondary | ICD-10-CM

## 2021-09-24 DIAGNOSIS — G4733 Obstructive sleep apnea (adult) (pediatric): Secondary | ICD-10-CM | POA: Diagnosis not present

## 2021-09-24 DIAGNOSIS — R599 Enlarged lymph nodes, unspecified: Secondary | ICD-10-CM | POA: Diagnosis not present

## 2021-09-24 LAB — BASIC METABOLIC PANEL
BUN: 13 mg/dL (ref 6–23)
CO2: 28 mEq/L (ref 19–32)
Calcium: 9 mg/dL (ref 8.4–10.5)
Chloride: 104 mEq/L (ref 96–112)
Creatinine, Ser: 1.14 mg/dL (ref 0.40–1.50)
GFR: 71.36 mL/min (ref >60.00)
Glucose, Bld: 100 mg/dL — ABNORMAL HIGH (ref 70–99)
Potassium: 4.6 mEq/L (ref 3.5–5.1)
Sodium: 138 mEq/L (ref 135–145)

## 2021-12-24 IMAGING — CT CT ABD-PEL WO/W CM
3 of 12 series · 11 of 46 positions shown, 17 images · IV contrast (APPLIED)
Comparison: None

CLINICAL DATA: Gross hematuria for 2-3 weeks.

EXAM:
CT ABDOMEN AND PELVIS WITHOUT AND WITH CONTRAST
TECHNIQUE: Multidetector CT imaging of the abdomen and pelvis was performed
following the standard protocol before and following the bolus
administration of intravenous contrast.
CONTRAST:  125mL OMNIPAQUE IOHEXOL 300 MG/ML  SOLN

[Series 2: axial pre · axial · non-contrast · 0.92mm/px · z∈[-533,-458]mm · 2 of 103 slices shown]
[im 15/103  soft-tissue]
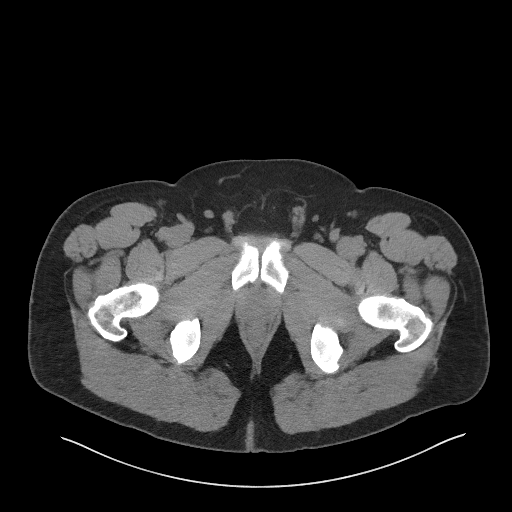
[im 30/103  soft-tissue]
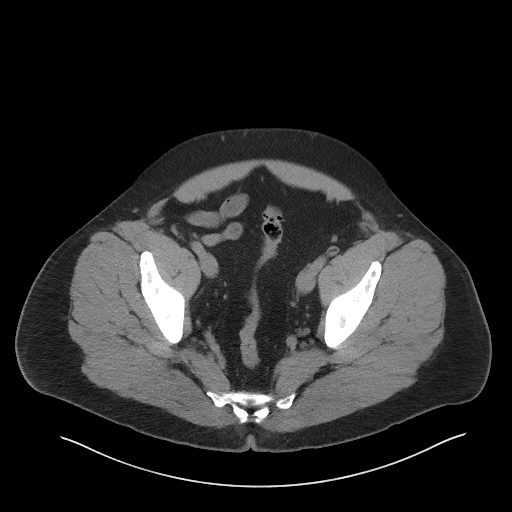

[Series 5: coronal pre · coronal · non-contrast · 0.88mm/px · 2 of 123 slices shown, 3 images]
[im 41/123  soft-tissue]
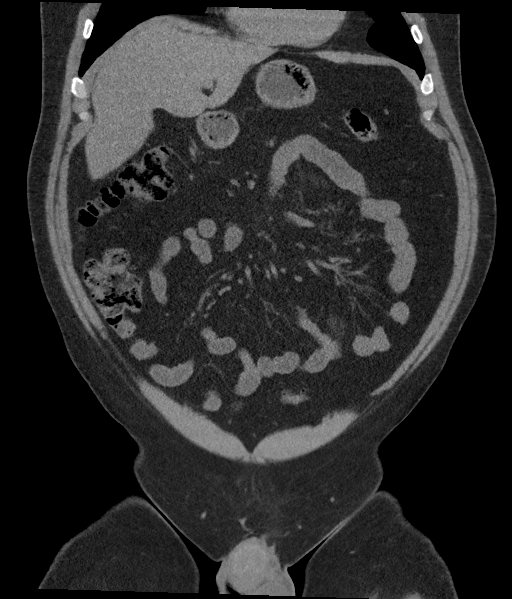
[im 41/123  bone]
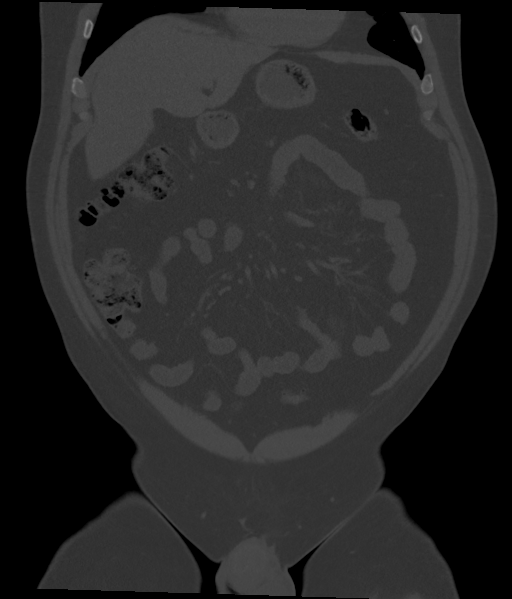
[im 82/123  soft-tissue]
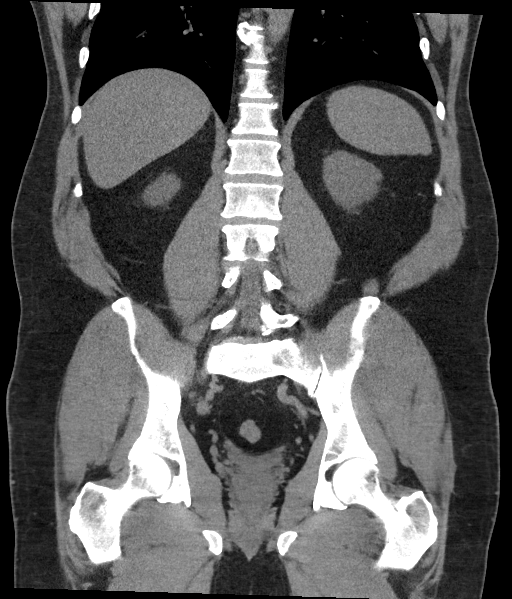

[Series 13: axial delay · axial · delayed · 0.98mm/px · z∈[-745,-340]mm · 7 of 109 slices shown, 12 images]
[im 14/109  soft-tissue]
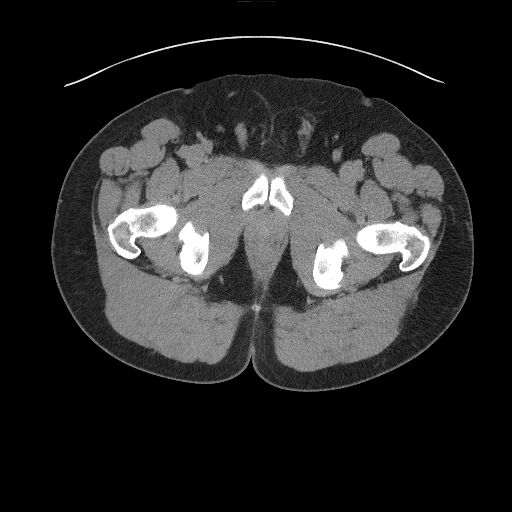
[im 14/109  bone]
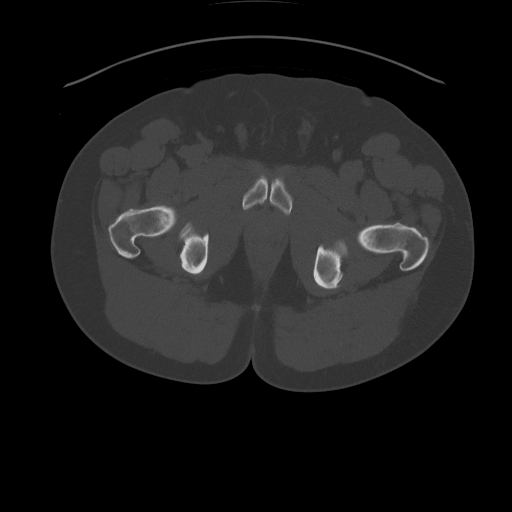
[im 28/109  soft-tissue]
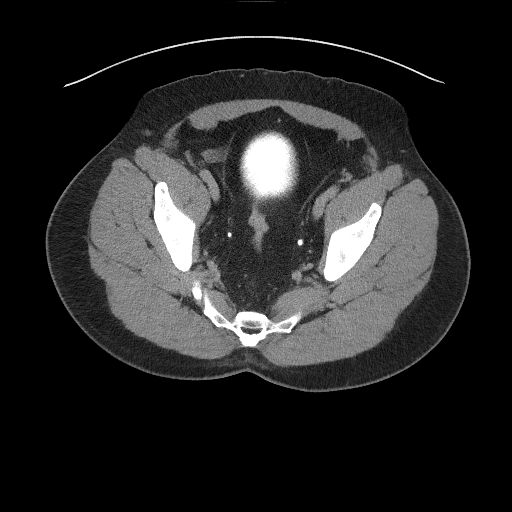
[im 41/109  soft-tissue]
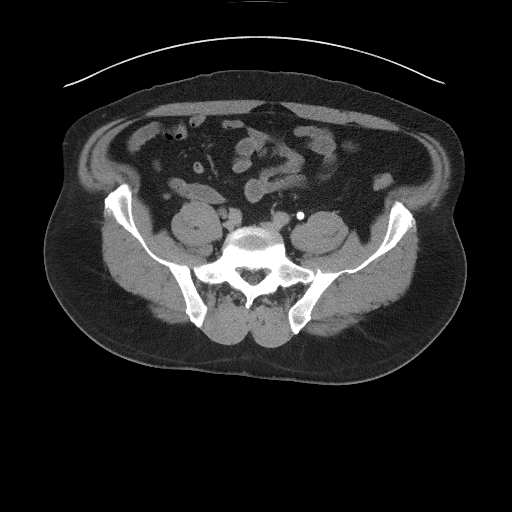
[im 55/109  soft-tissue]
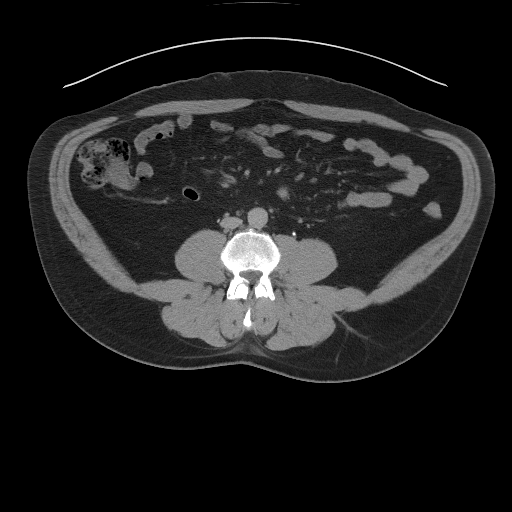
[im 55/109  lung]
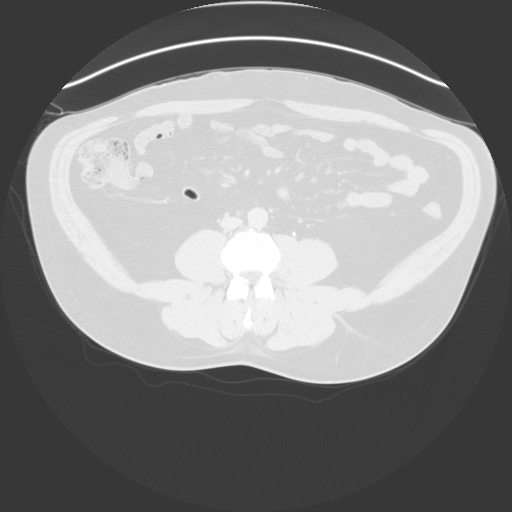
[im 68/109  soft-tissue]
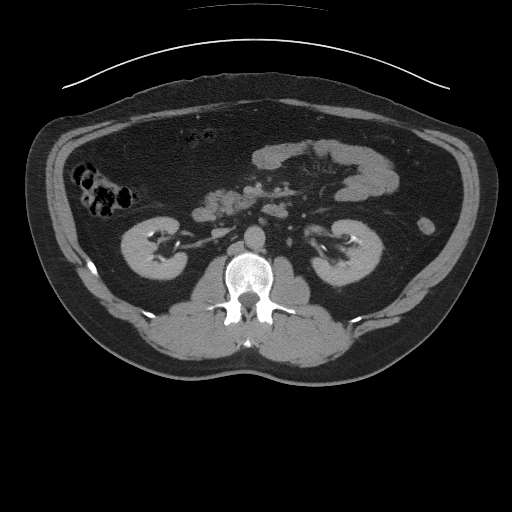
[im 68/109  lung]
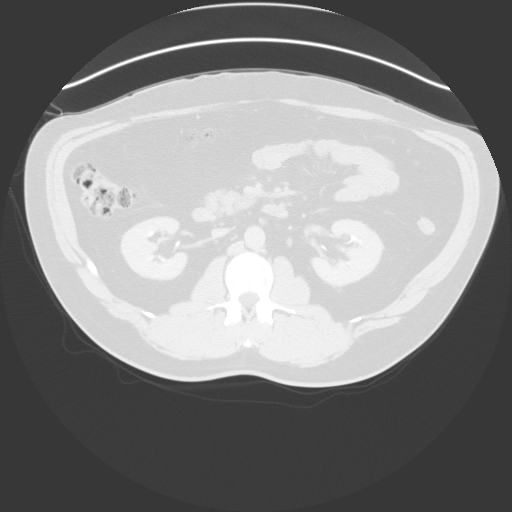
[im 82/109  soft-tissue]
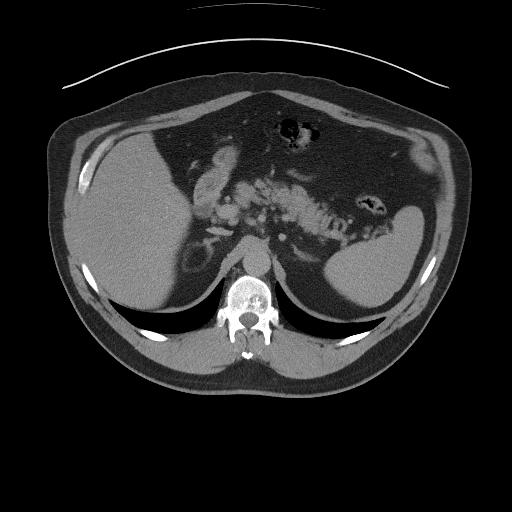
[im 82/109  lung]
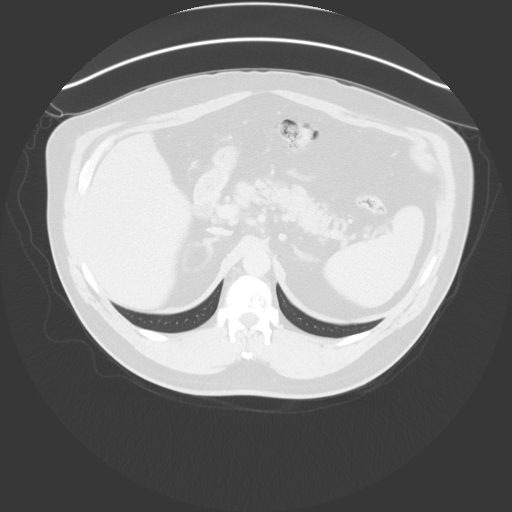
[im 95/109  soft-tissue]
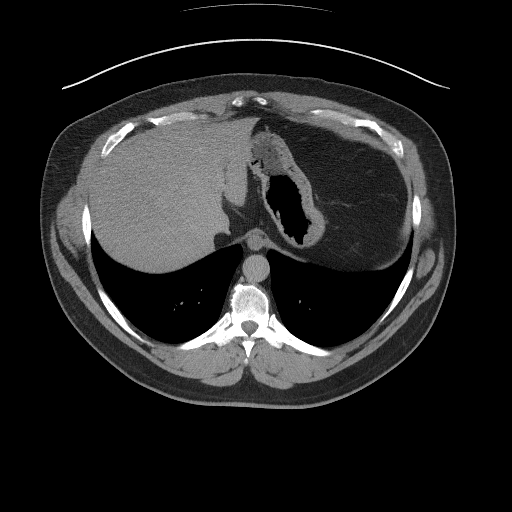
[im 95/109  lung]
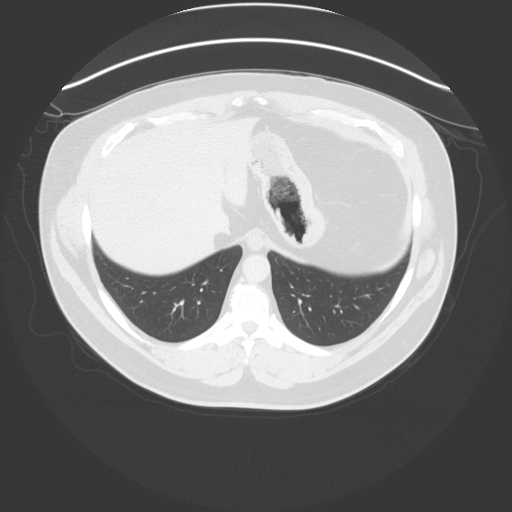

[11 of 46 positions shown; findings below may reference images not displayed]

FINDINGS: Lower chest: Lung bases are clear. No pleural effusion or
consolidation.

Hepatobiliary: Mildly lobular hepatic contours. No focal, suspicious
hepatic lesion. Post cholecystectomy. Portal vein is patent. No
biliary ductal dilation.

Pancreas: Pancreas is normal. No ductal dilation, inflammation or
suspicious lesion.

Spleen: Spleen normal size without focal lesion.

Adrenals/Urinary Tract: Adrenal glands are normal.

Renal contours are smooth with symmetric renal enhancement. A 7 x 4
mm calculus is noted in the distal LEFT ureter just proximal to the
LEFT UVJ. The urinary bladder is under distended.

LEFT renal cyst in the interpolar LEFT kidney.

Excretory phase imaging without additional filling defect or sign of
stricture or ureteral thickening.

Stomach/Bowel: Gastrointestinal tract without acute process
visualized. The appendix is normal. Colonic diverticulosis.

Vascular/Lymphatic: Calcified and noncalcified plaque is minimal in
the thoracic aorta no adenopathy in the retroperitoneum. 11 mm
celiac lymph node adjacent to porta hepatis.

RIGHT pelvic lymph node at 11 mm with rounded morphology in the
axial plane, ovoid in coronal. (Image 63, series 7)

Small scattered lymph nodes elsewhere in the pelvis.

Reproductive: Prostate mildly heterogeneous, nonspecific finding on
CT.

Other: No abdominal wall hernia. No ascites.

Musculoskeletal: No acute musculoskeletal process. Spinal
degenerative changes. No destructive bone finding.
IMPRESSION: 1. A 7 x 4 mm calculus is noted in the distal LEFT ureter just
proximal to the LEFT UVJ. No hydronephrosis or hydroureter.
2. Mildly lobular hepatic contours with mildly enlarged periportal
lymph nodes, correlate with any clinical or laboratory evidence of
liver disease. No signs of portal hypertension.
3. Borderline enlarged lymph node in the RIGHT pelvis with small
nodes throughout the pelvis. Perhaps this is reactive. Given lack of
fatty hilum three-month follow-up may be helpful to assess for
stability. This patient also reportedly has a history of skin
cancer. Correlate with site of skin cancer to determine whether risk
for pelvic metastasis is a consideration.
4. Colonic diverticulosis without diverticulitis.
5. Mildly heterogeneous prostate, nonspecific finding on CT.
6. Aortic atherosclerosis.

Aortic Atherosclerosis (6Y898-1A4.4).

## 2022-01-08 ENCOUNTER — Ambulatory Visit: Payer: BC Managed Care – PPO | Admitting: Urology

## 2022-01-15 ENCOUNTER — Encounter: Payer: Self-pay | Admitting: Urology

## 2022-01-15 ENCOUNTER — Ambulatory Visit: Payer: BC Managed Care – PPO | Admitting: Urology

## 2022-01-15 ENCOUNTER — Ambulatory Visit
Admission: RE | Admit: 2022-01-15 | Discharge: 2022-01-15 | Disposition: A | Discharge status: Home / Self Care | Payer: BC Managed Care – PPO | Source: Ambulatory Visit | Attending: Urology | Admitting: Urology

## 2022-01-15 ENCOUNTER — Other Ambulatory Visit: Payer: Self-pay | Admitting: *Deleted

## 2022-01-15 ENCOUNTER — Ambulatory Visit
Admission: RE | Admit: 2022-01-15 | Discharge: 2022-01-15 | Disposition: A | Discharge status: Home / Self Care | Payer: BC Managed Care – PPO | Attending: Urology | Admitting: Urology

## 2022-01-15 VITALS — BP 134/78 | HR 72 | Ht 73.0 in | Wt 260.0 lb

## 2022-01-15 DIAGNOSIS — N200 Calculus of kidney: Secondary | ICD-10-CM

## 2022-01-15 DIAGNOSIS — Z125 Encounter for screening for malignant neoplasm of prostate: Secondary | ICD-10-CM

## 2022-01-15 DIAGNOSIS — Z87442 Personal history of urinary calculi: Secondary | ICD-10-CM | POA: Diagnosis not present

## 2022-01-15 DIAGNOSIS — Z9049 Acquired absence of other specified parts of digestive tract: Secondary | ICD-10-CM | POA: Diagnosis not present

## 2022-01-15 NOTE — Addendum Note (Signed)
Addended by: Donalee Citrin on: 01/15/2022 03:38 PM   Modules accepted: Orders

## 2022-01-15 NOTE — Patient Instructions (Signed)

## 2022-01-15 NOTE — Progress Notes (Signed)
   01/15/2022 3:34 PM   Gregory Valencia 24-Jan-1966 [id number]  Reason for visit: Follow up nephrolithiasis, PSA screening  HPI: 56 year old male who originally presented in April 2021 with gross hematuria and was found to have a 7 mm asymptomatic left distal ureteral stone and underwent ureteroscopy and stone removal.  PSA at that time was 2.0.  He has not had any stone events since that time.  I personally viewed and interpreted the KUB today that shows possible small nonobstructing renal stones bilaterally, relatively stable from last year.  Most recent PSA is stable at 2.3.  We reviewed the AUA guidelines that recommend screening every 1 to 2 years through age 68.  PCP will continue to draw his PSA.  He denies any problems with gross hematuria, urinary symptoms, or ED.  We discussed general stone prevention strategies including adequate hydration with goal of producing 2.5 L of urine daily, increasing citric acid intake, increasing calcium intake during high oxalate meals, minimizing animal protein, and decreasing salt intake. Information about dietary recommendations given today.   RTC 1 year KUB, continue yearly PSA through PCP   Billey Co, MD  [facility] [address], [place], [place] [address] [phone]

## 2022-02-04 ENCOUNTER — Ambulatory Visit: Payer: BC Managed Care – PPO | Admitting: Family Medicine

## 2022-02-04 ENCOUNTER — Encounter: Payer: Self-pay | Admitting: Family Medicine

## 2022-02-04 DIAGNOSIS — F419 Anxiety disorder, unspecified: Secondary | ICD-10-CM | POA: Diagnosis not present

## 2022-02-04 MED ORDER — BUSPIRONE HCL 5 MG PO TABS: 5.0000 mg | ORAL_TABLET | Freq: Two times a day (BID) | ORAL | 1 refills | Status: DC

## 2022-02-04 NOTE — Patient Instructions (Signed)
Start with 1 tab twice a day.   Increase to 2 AM and 1 PM in about 1 week if needed/tolerated.  Then can increase to 2 AM and 2 PM.    Update me in about 10-14 days.   Take care.  Glad to see you.

## 2022-02-04 NOTE — Progress Notes (Unsigned)
Anxiety.  His boss is changing jobs and that is potentially going to affect him. He gets overwhelmed with changes.  Sx predate the announcement from his boss but worse in the last month.  BP was up at [employer] physical.  BP improved today.  No SI/HI.  More withdrawn socially now.  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  No illicits.  Minimal alcohol.  Meds, vitals, and allergies reviewed.   ROS: Per HPI unless specifically indicated in ROS section   GEN: nad, alert and oriented HEENT:ncat NECK: supple w/o LA CV: rrr.  PULM: ctab, no inc wob ABD: soft, +bs EXT: no edema SKIN: Well-perfused. No tremor.  Speech and judgment appear normal.

## 2022-02-05 DIAGNOSIS — F419 Anxiety disorder, unspecified: Secondary | ICD-10-CM | POA: Insufficient documentation

## 2022-02-05 NOTE — Assessment & Plan Note (Signed)
Discussed options.  He can consider counseling.  Would avoid sedating medications.  He should be able to tolerate BuSpar.  Discussed taking 5 mg twice a day initially then increasing to 10 mg in the morning and 5 mg at night if needed.  He can then gradually increase to 10 mg twice a day if needed.  He can update me as needed.  See after visit summary.  I asked him to let me know how he was doing as he goes along.  Okay for outpatient follow-up. 30 minutes were devoted to patient care in this encounter (this includes time spent reviewing the patient's file/history, interviewing and examining the patient, counseling/reviewing plan with patient).

## 2022-03-13 ENCOUNTER — Other Ambulatory Visit: Payer: Self-pay | Admitting: Family Medicine

## 2022-03-18 DIAGNOSIS — G4733 Obstructive sleep apnea (adult) (pediatric): Secondary | ICD-10-CM | POA: Diagnosis not present

## 2022-04-16 ENCOUNTER — Encounter: Payer: Self-pay | Admitting: Primary Care

## 2022-04-16 ENCOUNTER — Ambulatory Visit: Payer: BC Managed Care – PPO | Admitting: Family Medicine

## 2022-04-16 ENCOUNTER — Ambulatory Visit: Payer: BC Managed Care – PPO | Admitting: Primary Care

## 2022-04-16 VITALS — BP 126/68 | HR 85 | Temp 98.6°F | Ht 73.0 in | Wt 248.0 lb

## 2022-04-16 DIAGNOSIS — H9202 Otalgia, left ear: Secondary | ICD-10-CM | POA: Diagnosis not present

## 2022-04-16 NOTE — Patient Instructions (Signed)
Nasal Congestion/Ear Pressure/Sinus Pressure: Try using Flonase (fluticasone) nasal spray. Instill 1 spray in each nostril twice daily.   It was a pleasure meeting you!
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
## 2022-04-16 NOTE — Assessment & Plan Note (Signed)
No infection. There is evidence of mild bulging which could suggest allergies/environmental changes as cause.  Discussed use of Flonase BID PRN.  Otherwise exam is completely benign.   Follow up PRN.

## 2022-04-18 DIAGNOSIS — G4733 Obstructive sleep apnea (adult) (pediatric): Secondary | ICD-10-CM | POA: Diagnosis not present

## 2022-05-14 ENCOUNTER — Encounter: Payer: Self-pay | Admitting: Internal Medicine

## 2022-05-14 ENCOUNTER — Ambulatory Visit: Payer: BC Managed Care – PPO | Admitting: Internal Medicine

## 2022-05-14 VITALS — BP 120/84 | HR 71 | Temp 97.3°F | Ht 73.0 in | Wt 243.0 lb

## 2022-05-14 DIAGNOSIS — M659 Synovitis and tenosynovitis, unspecified: Secondary | ICD-10-CM | POA: Insufficient documentation

## 2022-05-14 LAB — RENAL FUNCTION PANEL
Albumin: 4.4 g/dL (ref 3.5–5.2)
BUN: 16 mg/dL (ref 6–23)
CO2: 31 meq/L (ref 19–32)
Calcium: 9.8 mg/dL (ref 8.4–10.5)
Chloride: 104 meq/L (ref 96–112)
Creatinine, Ser: 1.29 mg/dL (ref 0.40–1.50)
GFR: 61.25 mL/min
Glucose, Bld: 93 mg/dL (ref 70–99)
Phosphorus: 3.7 mg/dL (ref 2.3–4.6)
Potassium: 4.5 meq/L (ref 3.5–5.1)
Sodium: 142 meq/L (ref 135–145)

## 2022-05-14 LAB — URIC ACID: Uric Acid, Serum: 6.7 mg/dL (ref 4.0–7.8)

## 2022-05-14 MED ORDER — COLCHICINE 0.6 MG PO TABS: 0.6000 mg | ORAL_TABLET | Freq: Two times a day (BID) | ORAL | 0 refills | Status: DC | PRN
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
## 2022-05-14 NOTE — Patient Instructions (Signed)
Continue the ibuprofen '800mg'$  up to three times a day with food. Try the colchicine 2 tabs first--then another in 1-2 hours (that would be it for today). Continue twice a day if it helps till your heel has completely quieted down. You can also try over the counter topical diclofenac gel three times a day. If you are not much better by Friday morning, send a message and we will try something else.

## 2022-05-14 NOTE — Assessment & Plan Note (Signed)
The severity and lack of sig injury make me think of gout No prior history Will try NSAIDs and topical diclofenac Colchicine as well Check renal, uric acid (if high, not diagnostic but adds info) Prednisone if doesn't improve by 12/15

## 2022-05-18 DIAGNOSIS — G4733 Obstructive sleep apnea (adult) (pediatric): Secondary | ICD-10-CM | POA: Diagnosis not present

## 2022-05-20 ENCOUNTER — Other Ambulatory Visit: Payer: Self-pay | Admitting: Family Medicine

## 2022-07-15 ENCOUNTER — Encounter: Payer: Self-pay | Admitting: Dermatology

## 2022-07-15 ENCOUNTER — Ambulatory Visit: Payer: BC Managed Care – PPO | Admitting: Dermatology

## 2022-07-15 VITALS — BP 147/90 | HR 77

## 2022-07-15 DIAGNOSIS — L820 Inflamed seborrheic keratosis: Secondary | ICD-10-CM

## 2022-07-15 DIAGNOSIS — L739 Follicular disorder, unspecified: Secondary | ICD-10-CM

## 2022-07-15 DIAGNOSIS — L578 Other skin changes due to chronic exposure to nonionizing radiation: Secondary | ICD-10-CM | POA: Diagnosis not present

## 2022-07-15 DIAGNOSIS — L814 Other melanin hyperpigmentation: Secondary | ICD-10-CM

## 2022-07-15 DIAGNOSIS — L821 Other seborrheic keratosis: Secondary | ICD-10-CM

## 2022-07-15 DIAGNOSIS — L570 Actinic keratosis: Secondary | ICD-10-CM | POA: Diagnosis not present

## 2022-07-15 DIAGNOSIS — Z1283 Encounter for screening for malignant neoplasm of skin: Secondary | ICD-10-CM | POA: Diagnosis not present

## 2022-07-15 DIAGNOSIS — Z85828 Personal history of other malignant neoplasm of skin: Secondary | ICD-10-CM

## 2022-07-15 DIAGNOSIS — D229 Melanocytic nevi, unspecified: Secondary | ICD-10-CM

## 2022-07-15 NOTE — Patient Instructions (Addendum)
Photodynamic Therapy/Red Light Therapy  Actinic keratoses are the dry, red scaly spots on the skin caused by sun damage. A portion of these spots can turn into skin cancer with time, and treating them can help prevent development of skin cancer.   Treatment of these spots requires removal of the defective skin cells. There are various ways to remove actinic keratoses, including freezing with liquid nitrogen, treatment with creams, or treatment with a blue light procedure in the office.   Photodynamic Therapy (PDT), also known as "Red light therapy" is an in office procedure used to treat actinic keratoses. It works by targeting precancerous cells. After treatment, these cells peel off and are replaced by healthy ones.   For your phototherapy appointment, you will have two appointments on the day of your treatment. The first appointment will be to apply a cream to the treatment area. You will leave this cream on for 1-2 hours depending on the area being treated. The second appointment will be to shine a blue light on the area for 16 minutes to kill off the precancer cells. It is common to experience a burning sensation during the treatment.  After your treatment, it will be important to keep the treated areas of skin out of the sun completely for 48-72 hours (2-3 days) to prevent having a reaction.   Common side effects include: - Burning or stinging, which may be severe and can last up to 24-72 hours after your treatment - Scaling and crusting which may last up to 2 weeks - Redness, swelling and/or peeling which can last up to 4 weeks  To Care for Your Skin After PDT/Red Light Therapy: - Wash with soap, water and shampoo as normal. - If needed, you can use cold compresses (e.g. ice packs) for comfort - If okay with your primary care doctor, you may use analgesics such as acetaminophen (tylenol) every 4-6 hours, not to exceed recommended dose - You may apply Cerave Healing Ointment, Vaseline or  Aquaphor as needed - If you have a lot of swelling you may take a Benadryl to help with this (this may cause drowsiness), not to exceed recommended dose. This may increase the risk of falls in people over 65 and may slow reaction time while driving, so it is not recommended to take before driving or operating machinery. - Sun Precautions - Wear a wide brim hat for the next week if outside  - Wear a sunblock with zinc or titanium dioxide at least SPF 50 daily  If you have any questions or concerns, please call the office and ask to speak with a nurse.   --------------------------------------------------------------------------------------------------------------      Actinic keratoses are precancerous spots that appear secondary to cumulative UV radiation exposure/sun exposure over time. They are chronic with expected duration over 1 year. A portion of actinic keratoses will progress to squamous cell carcinoma of the skin. It is not possible to reliably predict which spots will progress to skin cancer and so treatment is recommended to prevent development of skin cancer.  Recommend daily broad spectrum sunscreen SPF 30+ to sun-exposed areas, reapply every 2 hours as needed.  Recommend staying in the shade or wearing long sleeves, sun glasses (UVA+UVB protection) and wide brim hats (4-inch brim around the entire circumference of the hat). Call for new or changing lesions.    Cryotherapy Aftercare  Wash gently with soap and water everyday.   Apply Vaseline and Band-Aid daily until healed.     Seborrheic Keratosis  What causes seborrheic keratoses? Seborrheic keratoses are harmless, common skin growths that first appear during adult life.  As time goes by, more growths appear.  Some people may develop a large number of them.  Seborrheic keratoses appear on both covered and uncovered body parts.  They are not caused by sunlight.  The tendency to develop seborrheic keratoses can be inherited.   They vary in color from skin-colored to gray, brown, or even black.  They can be either smooth or have a rough, warty surface.   Seborrheic keratoses are superficial and look as if they were stuck on the skin.  Under the microscope this type of keratosis looks like layers upon layers of skin.  That is why at times the top layer may seem to fall off, but the rest of the growth remains and re-grows.    Treatment Seborrheic keratoses do not need to be treated, but can easily be removed in the office.  Seborrheic keratoses often cause symptoms when they rub on clothing or jewelry.  Lesions can be in the way of shaving.  If they become inflamed, they can cause itching, soreness, or burning.  Removal of a seborrheic keratosis can be accomplished by freezing, burning, or surgery. If any spot bleeds, scabs, or grows rapidly, please return to have it checked, as these can be an indication of a skin cancer.       Melanoma ABCDEs  Melanoma is the most dangerous type of skin cancer, and is the leading cause of death from skin disease.  You are more likely to develop melanoma if you: Have light-colored skin, light-colored eyes, or red or blond hair Spend a lot of time in the sun Tan regularly, either outdoors or in a tanning bed Have had blistering sunburns, especially during childhood Have a close family member who has had a melanoma Have atypical moles or large birthmarks  Early detection of melanoma is key since treatment is typically straightforward and cure rates are extremely high if we catch it early.   The first sign of melanoma is often a change in a mole or a new dark spot.  The ABCDE system is a way of remembering the signs of melanoma.  A for asymmetry:  The two halves do not match. B for border:  The edges of the growth are irregular. C for color:  A mixture of colors are present instead of an even brown color. D for diameter:  Melanomas are usually (but not always) greater than 27m -  the size of a pencil eraser. E for evolution:  The spot keeps changing in size, shape, and color.  Please check your skin once per month between visits. You can use a small mirror in front and a large mirror behind you to keep an eye on the back side or your body.   If you see any new or changing lesions before your next follow-up, please call to schedule a visit.  Please continue daily skin protection including broad spectrum sunscreen SPF 30+ to sun-exposed areas, reapplying every 2 hours as needed when you're outdoors.   Staying in the shade or wearing long sleeves, sun glasses (UVA+UVB protection) and wide brim hats (4-inch brim around the entire circumference of the hat) are also recommended for sun protection.    Due to recent changes in healthcare laws, you may see results of your pathology and/or laboratory studies on MyChart before the doctors have had a chance to review them. We understand that in some cases  there may be results that are confusing or concerning to you. Please understand that not all results are received at the same time and often the doctors may need to interpret multiple results in order to provide you with the best plan of care or course of treatment. Therefore, we ask that you please give [place] 2 business days to thoroughly review all your results before contacting the office for clarification. Should we see a critical lab result, you will be contacted sooner.   If You Need Anything After Your Visit  If you have any questions or concerns for your doctor, please call our main line at [phone] and press option 4 to reach your doctor's medical assistant. If no one answers, please leave a voicemail as directed and we will return your call as soon as possible. Messages left after 4 pm will be answered the following business day.   You may also send [place] a message via [place]. We typically respond to MyChart messages within 1-2 business days.  For prescription refills,  please ask your pharmacy to contact our office. Our fax number is [phone].  If you have an urgent issue when the clinic is closed that cannot wait until the next business day, you can page your doctor at the number below.    Please note that while we do our best to be available for urgent issues outside of office hours, we are not available 24/7.   If you have an urgent issue and are unable to reach [place], you may choose to seek medical care at your doctor's office, retail clinic, urgent care center, or emergency room.  If you have a medical emergency, please immediately call 911 or go to the emergency department.  Pager Numbers  - Dr. Nehemiah Massed: [phone]  - Dr. Laurence Ferrari: [phone]  - Dr. Nicole Kindred: [phone]  In the event of inclement weather, please call our main line at [phone] for an update on the status of any delays or closures.  Dermatology Medication Tips: Please keep the boxes that topical medications come in in order to help keep track of the instructions about where and how to use these. Pharmacies typically print the medication instructions only on the boxes and not directly on the medication tubes.   If your medication is too expensive, please contact our office at [phone] option 4 or send [place] a message through [place].   We are unable to tell what your co-pay for medications will be in advance as this is different depending on your insurance coverage. However, we may be able to find a substitute medication at lower cost or fill out paperwork to get insurance to cover a needed medication.   If a prior authorization is required to get your medication covered by your insurance company, please allow [place] 1-2 business days to complete this process.  Drug prices often vary depending on where the prescription is filled and some pharmacies may offer cheaper prices.  The website www.goodrx.com contains coupons for medications through different pharmacies. The prices  here do not account for what the cost may be with help from insurance (it may be cheaper with your insurance), but the website can give you the price if you did not use any insurance.  - You can print the associated coupon and take it with your prescription to the pharmacy.  - You may also stop by our office during regular business hours and pick up a GoodRx coupon card.  - If you need your prescription sent electronically  to a different pharmacy, notify our office through [facility] or by phone at [phone] option 4.     Si Usted Necesita Algo Despus de Su Visita  Tambin puede enviarnos un mensaje a travs de Pharmacist, community. Por lo general respondemos a los mensajes de MyChart en el transcurso de 1 a 2 das hbiles.  Para renovar recetas, por favor pida a su farmacia que se ponga en contacto con nuestra oficina. Harland Dingwall de fax es [place] [phone].  Si tiene un asunto urgente cuando la clnica est cerrada y que no puede esperar hasta el siguiente da hbil, puede llamar/localizar a su doctor(a) al nmero que aparece a continuacin.   Por favor, tenga en cuenta que aunque hacemos todo lo posible para estar disponibles para asuntos urgentes fuera del horario de [place], no estamos disponibles las 24 horas del da, los 7 das de la [place].   Si tiene un problema urgente y no puede comunicarse con nosotros, puede optar por buscar atencin mdica  en el consultorio de su doctor(a), en una clnica privada, en un centro de atencin urgente o en una sala de emergencias.  Si tiene Engineering geologist, por favor llame inmediatamente al 911 o vaya a la sala de emergencias.  Nmeros de bper  - Dr. Nehemiah Massed: [phone]  - Dra. Moye: [phone]  - Dra. Nicole Kindred: [phone]  En caso de inclemencias del [place], por favor llame a Johnsie Kindred principal al [phone] para una actualizacin sobre el [place] de cualquier retraso o cierre.  Consejos para la medicacin en  dermatologa: Por favor, guarde las cajas en las que vienen los medicamentos de uso tpico para ayudarle a seguir las instrucciones sobre dnde y cmo usarlos. Las farmacias generalmente imprimen las instrucciones del medicamento slo en las cajas y no directamente en los tubos del [place].   Si su medicamento es muy caro, por favor, pngase en contacto con Zigmund Daniel llamando al [phone] y presione la opcin 4 o envenos un mensaje a travs de Pharmacist, community.   No podemos decirle cul ser su copago por los medicamentos por adelantado ya que esto es diferente dependiendo de la cobertura de su seguro. Sin embargo, es posible que podamos encontrar un medicamento sustituto a Electrical engineer un formulario para que el seguro cubra el medicamento que se considera necesario.   Si se requiere una autorizacin previa para que su compaa de seguros [place] su medicamento, por favor permtanos de 1 a 2 das hbiles para completar este proceso.  Los precios de los medicamentos varan con frecuencia dependiendo del Environmental consultant de dnde se surte la receta y alguna farmacias pueden ofrecer precios ms baratos.  El sitio web www.goodrx.com tiene cupones para medicamentos de Airline pilot. Los precios aqu no tienen en cuenta lo que podra costar con la ayuda del seguro (puede ser ms barato con su seguro), pero el sitio web puede darle el precio si no utiliz Research scientist (physical sciences).  - Puede imprimir el cupn correspondiente y llevarlo con su receta a la farmacia.  - Tambin puede pasar por nuestra oficina durante el horario de atencin regular y Charity fundraiser una tarjeta de cupones de GoodRx.  - Si necesita que su receta se enve electrnicamente a una farmacia diferente, informe a nuestra oficina a travs de MyChart de [place] o por telfono llamando al [phone] y presione la opcin 4.

## 2022-07-15 NOTE — Progress Notes (Signed)
Follow-Up Visit   Subjective  Gregory Valencia is a 57 y.o. male who presents for the following: Annual Exam (Tbse, hx of folliculitis, hx of isk, hx of aks, hx of scc. Reports some areas at scalp, left side and legs he is concerned about. ).  Some spots are itchy on side and arm.  The patient presents for Total-Body Skin Exam (TBSE) for skin cancer screening and mole check.  The patient has spots, moles and lesions to be evaluated, some may be new or changing and the patient has concerns that these could be cancer.  The following portions of the chart were reviewed this encounter and updated as appropriate:      Review of Systems: No other skin or systemic complaints except as noted in HPI or Assessment and Plan.   Objective  Well appearing patient in no apparent distress; mood and affect are within normal limits.  All skin waist up examined. Including legs  back Follicular pink patches at back  forehead x 3 , scalp x 10,occipital scalp x 1, left posterior parietel scalp x 1, left upper ear helix at rim x 1 (16) keratotic papules scalp x 2, L ear helix Erythematous thin papules/macules with gritty scale scalp, forehead  right upper arm x 1, left lower flank x 2 (3) Erythematous stuck-on, waxy papule   Assessment & Plan  Folliculitis back  Mild  Recommend OTC antibacterial soap in the shower. If worsens, will send Rx.     Actinic keratosis (16) forehead x 3 , scalp x 10,occipital scalp x 1, left posterior parietel scalp x 1, left upper ear helix at rim x 1  Hypertrophic aks at occipital scalp, left posterior parietel scalp, left upper ear helix at rim  Discussed PDT vs 5 f/u field treatment cream to scalp  Prefers Red light PDT with debridment to scalp, information included in patient hand out  Actinic keratoses are precancerous spots that appear secondary to cumulative UV radiation exposure/sun exposure over time. They are chronic with expected duration over 1 year. A  portion of actinic keratoses will progress to squamous cell carcinoma of the skin. It is not possible to reliably predict which spots will progress to skin cancer and so treatment is recommended to prevent development of skin cancer.  Recommend daily broad spectrum sunscreen SPF 30+ to sun-exposed areas, reapply every 2 hours as needed.  Recommend staying in the shade or wearing long sleeves, sun glasses (UVA+UVB protection) and wide brim hats (4-inch brim around the entire circumference of the hat). Call for new or changing lesions.  Destruction of lesion - forehead x 3 , scalp x 10,occipital scalp x 1, left posterior parietel scalp x 1, left upper ear helix at rim x 1  Destruction method: cryotherapy   Informed consent: discussed and consent obtained   Timeout:  patient name, date of birth, surgical site, and procedure verified Lesion destroyed using liquid nitrogen: Yes   Region frozen until ice ball extended beyond lesion: Yes   Outcome: patient tolerated procedure well with no complications   Post-procedure details: wound care instructions given   Additional details:  Prior to procedure, discussed risks of blister formation, small wound, skin dyspigmentation, or rare scar following cryotherapy. Recommend Vaseline ointment to treated areas while healing.   Inflamed seborrheic keratosis (3) right upper arm x 1, left lower flank x 2  Symptomatic, irritating, patient would like treated.  Destruction of lesion - right upper arm x 1, left lower flank x 2  Destruction method: cryotherapy   Informed consent: discussed and consent obtained   Lesion destroyed using liquid nitrogen: Yes   Region frozen until ice ball extended beyond lesion: Yes   Outcome: patient tolerated procedure well with no complications   Post-procedure details: wound care instructions given   Additional details:  Prior to procedure, discussed risks of blister formation, small wound, skin dyspigmentation, or rare scar  following cryotherapy. Recommend Vaseline ointment to treated areas while healing.   Lentigines - Scattered tan macules - Due to sun exposure - Benign-appearing, observe - Recommend daily broad spectrum sunscreen SPF 30+ to sun-exposed areas, reapply every 2 hours as needed. - Call for any changes  Seborrheic Keratoses Back, arms, chest, face, left side, legs - Stuck-on, waxy, tan-brown papules and/or plaques  - Benign-appearing - Discussed benign etiology and prognosis. - Observe - Call for any changes  Melanocytic Nevi - Tan-brown and/or pink-flesh-colored symmetric macules and papules - Benign appearing on exam today - Observation - Call clinic for new or changing moles - Recommend daily use of broad spectrum spf 30+ sunscreen to sun-exposed areas.   Hemangiomas - Red papules - Discussed benign nature - Observe - Call for any changes  Actinic Damage with PreCancerous Actinic Keratoses Counseling for Topical Chemotherapy Management: Patient exhibits: - Severe, confluent actinic changes with pre-cancerous actinic keratoses that is secondary to cumulative UV radiation exposure over time - Condition that is severe; chronic, not at goal. - diffuse scaly erythematous macules and papules with underlying dyspigmentation - Discussed Prescription "Field Treatment" topical Chemotherapy for Severe, Chronic Confluent Actinic Changes with Pre-Cancerous Actinic Keratoses Field treatment involves treatment of an entire area of skin that has confluent Actinic Changes (Sun/ Ultraviolet light damage) and PreCancerous Actinic Keratoses by method of PhotoDynamic Therapy (PDT) and/or prescription Topical Chemotherapy agents such as 5-fluorouracil, 5-fluorouracil/calcipotriene, and/or imiquimod.  The purpose is to decrease the number of clinically evident and subclinical PreCancerous lesions to prevent progression to development of skin cancer by chemically destroying early precancer changes that  may or may not be visible.  It has been shown to reduce the risk of developing skin cancer in the treated area. As a result of treatment, redness, scaling, crusting, and open sores may occur during treatment course. One or more than one of these methods may be used and may have to be used several times to control, suppress and eliminate the PreCancerous changes. Discussed treatment course, expected reaction, and possible side effects. - Recommend daily broad spectrum sunscreen SPF 30+ to sun-exposed areas, reapply every 2 hours as needed.  - Staying in the shade or wearing long sleeves, sun glasses (UVA+UVB protection) and wide brim hats (4-inch brim around the entire circumference of the hat) are also recommended. - Call for new or changing lesions.  - Will schedule Red light photodynamic therapy to the scalp with debridement    History of Squamous Cell Carcinoma of the Skin Left lateral thigh 2015  - No evidence of recurrence today - No lymphadenopathy - Recommend regular full body skin exams - Recommend daily broad spectrum sunscreen SPF 30+ to sun-exposed areas, reapply every 2 hours as needed.  - Call if any new or changing lesions are noted between office visits  Skin cancer screening performed today. Return for 3 - 4 month ak follow up , 1 year tbse .  I, Ruthell Rummage, CMA, am acting as scribe for Brendolyn Patty, MD.  Documentation: I have reviewed the above documentation for accuracy and completeness, and I agree with  the above.  Brendolyn Patty MD

## 2022-07-25 ENCOUNTER — Telehealth: Payer: Self-pay | Admitting: Family Medicine

## 2022-07-25 ENCOUNTER — Encounter: Payer: Self-pay | Admitting: Family Medicine

## 2022-07-25 ENCOUNTER — Ambulatory Visit: Payer: BC Managed Care – PPO | Admitting: Family Medicine

## 2022-07-25 VITALS — BP 130/84 | HR 77 | Temp 97.6°F | Ht 73.0 in | Wt 257.1 lb

## 2022-07-25 DIAGNOSIS — J029 Acute pharyngitis, unspecified: Secondary | ICD-10-CM | POA: Diagnosis not present

## 2022-07-25 DIAGNOSIS — J020 Streptococcal pharyngitis: Secondary | ICD-10-CM | POA: Insufficient documentation

## 2022-07-25 DIAGNOSIS — R350 Frequency of micturition: Secondary | ICD-10-CM | POA: Insufficient documentation

## 2022-07-25 DIAGNOSIS — R051 Acute cough: Secondary | ICD-10-CM | POA: Insufficient documentation

## 2022-07-25 LAB — POCT UA - MICROSCOPIC ONLY

## 2022-07-25 LAB — POC URINALSYSI DIPSTICK (AUTOMATED)
Bilirubin, UA: NEGATIVE
Glucose, UA: NEGATIVE
Ketones, UA: NEGATIVE
Leukocytes, UA: NEGATIVE
Nitrite, UA: NEGATIVE
Protein, UA: NEGATIVE
Spec Grav, UA: 1.025 (ref 1.010–1.025)
Urobilinogen, UA: 1 E.U./dL
pH, UA: 6 (ref 5.0–8.0)

## 2022-07-25 LAB — POCT RAPID STREP A (OFFICE): Rapid Strep A Screen: POSITIVE — AB

## 2022-07-25 MED ORDER — AMOXICILLIN 500 MG PO CAPS: 1000.0000 mg | ORAL_CAPSULE | Freq: Two times a day (BID) | ORAL | 0 refills | Status: DC

## 2022-07-25 NOTE — Patient Instructions (Signed)
Complete amoxicillin 2 capsules twice daily for 10 days to treat strep throat.  Can use Tylenol extra strength as needed for sore throat pain.   Rest and push fluids. Stop any decongestants. Follow-up with PCP if urinary frequency and blood pressure fluctuations not improving.

## 2022-07-25 NOTE — Telephone Encounter (Signed)
[facility] e-fax is down  Please either call in or send paper fax to [phone] for script submitted today

## 2022-07-25 NOTE — Progress Notes (Signed)
Patient ID: Gregory Valencia, male    DOB: 1966/03/23, 57 y.o.   MRN: [id number]  This visit was conducted in person.  BP 130/84   Pulse 77   Temp 97.6 F (36.4 C) (Temporal)   Ht '6\' 1"'$  (1.854 m)   Wt 257 lb 2 oz (116.6 kg)   SpO2 97%   BMI 33.92 kg/m    CC:  Chief Complaint  Patient presents with   Headache    Started Tuesday last week Negative Home Covid test yesterday   Ear Pain   Cough    With chest tightness   Sore Throat   Urinary Frequency    Getting up frequently at night to urinate    Subjective:   HPI: Gregory Valencia is a 57 y.o. male  patient of Dr. Damita Dunnings  with OSA presenting on 07/25/2022 for Headache (Started Tuesday last week/Negative Home Covid test yesterday), Ear Pain, Cough (With chest tightness), Sore Throat, and Urinary Frequency (Getting up frequently at night to urinate)   Date of onset:  1.5 weeks ago Initial symptoms included  chest tightness and dry cough Symptoms progressed to  headache, bilateral ear painful and ST.   Frontal sinus pain and pressure. Now more moist productive cough.Marland Kitchen green mucus off and on. Some blood from nares on day.  No fever, no body aches.  Sick contacts:  none COVID testing:   negative yesterday   He has tried to treat with  sudafed and zyrtec... helped minimally.     No history of chronic lung disease such as asthma or COPD. Non-smoker.  Has noted urinary frequency off and on for 2-3 weeks... no dysuria, no blood in urine  Patient states in the past he has had a kidney stone that was diagnosed when he had elevated blood pressure and blood in his urine.  He never had associated flank pain or urinary pain.  He is concerned that he has a stone at this time.  Relevant past medical, surgical, family and social history reviewed and updated as indicated. Interim medical history since our last visit reviewed. Allergies and medications reviewed and updated. Outpatient Medications Prior to Visit  Medication Sig Dispense  Refill   busPIRone (BUSPAR) 5 MG tablet TAKE 1 TO 2 TABLETS BY MOUTH TWICE DAILY 120 tablet 1   colchicine 0.6 MG tablet Take 1 tablet (0.6 mg total) by mouth 2 (two) times daily as needed. 60 tablet 0   Multiple Vitamin (MULTI VITAMIN DAILY PO) Take 1 tablet by mouth daily.     pantoprazole (PROTONIX) 40 MG tablet TAKE 1 TABLET BY MOUTH EVERY MORNING BEFORE BREAKFAST 90 tablet 3   No facility-administered medications prior to visit.     Per HPI unless specifically indicated in ROS section below Review of Systems  Constitutional:  Negative for fatigue and fever.  HENT:  Positive for ear pain and sinus pressure.   Eyes:  Negative for pain.  Respiratory:  Positive for cough. Negative for shortness of breath and wheezing.   Cardiovascular:  Negative for chest pain, palpitations and leg swelling.  Gastrointestinal:  Negative for abdominal pain.  Genitourinary:  Negative for dysuria.  Musculoskeletal:  Negative for arthralgias.  Neurological:  Negative for syncope, light-headedness and headaches.  Psychiatric/Behavioral:  Negative for dysphoric mood.    Objective:  BP 130/84   Pulse 77   Temp 97.6 F (36.4 C) (Temporal)   Ht '6\' 1"'$  (1.854 m)   Wt 257 lb 2 oz (116.6 kg)  SpO2 97%   BMI 33.92 kg/m   Wt Readings from Last 3 Encounters:  07/25/22 257 lb 2 oz (116.6 kg)  05/14/22 243 lb (110.2 kg)  04/16/22 248 lb (112.5 kg)      Physical Exam Constitutional:      General: He is not in acute distress.    Appearance: Normal appearance. He is well-developed. He is not ill-appearing or toxic-appearing.  HENT:     Head: Normocephalic and atraumatic.     Right Ear: Hearing, tympanic membrane, ear canal and external ear normal. No tenderness. No foreign body. Tympanic membrane is not retracted or bulging.     Left Ear: Hearing, tympanic membrane, ear canal and external ear normal. No tenderness. No foreign body. Tympanic membrane is not retracted or bulging.     Nose: Nose normal. No  mucosal edema or rhinorrhea.     Right Sinus: No maxillary sinus tenderness or frontal sinus tenderness.     Left Sinus: No maxillary sinus tenderness or frontal sinus tenderness.     Mouth/Throat:     Dentition: Normal dentition. No dental caries.     Pharynx: Uvula midline. No oropharyngeal exudate.     Tonsils: No tonsillar abscesses.  Eyes:     General: Lids are normal. Lids are everted, no foreign bodies appreciated.     Conjunctiva/sclera: Conjunctivae normal.     Pupils: Pupils are equal, round, and reactive to light.  Neck:     Thyroid: No thyroid mass or thyromegaly.     Vascular: No carotid bruit.     Trachea: Trachea and phonation normal.  Cardiovascular:     Rate and Rhythm: Normal rate and regular rhythm.     Pulses: Normal pulses.     Heart sounds: Normal heart sounds, S1 normal and S2 normal. No murmur heard.    No gallop.  Pulmonary:     Effort: Pulmonary effort is normal. No respiratory distress.     Breath sounds: Normal breath sounds. No wheezing, rhonchi or rales.  Abdominal:     General: Bowel sounds are normal.     Palpations: Abdomen is soft.     Tenderness: There is no abdominal tenderness. There is no guarding or rebound.     Hernia: No hernia is present.  Musculoskeletal:     Cervical back: Normal range of motion and neck supple.  Skin:    General: Skin is warm and dry.     Findings: No rash.  Neurological:     Mental Status: He is alert.     Deep Tendon Reflexes: Reflexes are normal and symmetric.  Psychiatric:        Speech: Speech normal.        Behavior: Behavior normal.        Judgment: Judgment normal.       Results for orders placed or performed in visit on 07/25/22  POCT Urinalysis Dipstick (Automated)  Result Value Ref Range   Color, UA Yellow    Clarity, UA Clear    Glucose, UA Negative Negative   Bilirubin, UA Negative    Ketones, UA Negative    Spec Grav, UA 1.025 1.010 - 1.025   Blood, UA Trace    pH, UA 6.0 5.0 - 8.0    Protein, UA Negative Negative   Urobilinogen, UA 1.0 0.2 or 1.0 E.U./dL   Nitrite, UA Negative    Leukocytes, UA Negative Negative  POCT UA - Microscopic Only  Result Value Ref Range   WBC, Ur, HPF,  POC 2-4 0 - 5   RBC, Urine, Miroscopic 2-4 0 - 2   Bacteria, U Microscopic     Mucus, UA     Epithelial cells, urine per micros many    Crystals, Ur, HPF, POC     Casts, Ur, LPF, POC none    Yeast, UA    POCT rapid strep A  Result Value Ref Range   Rapid Strep A Screen Positive (A) Negative    Assessment and Plan  Urinary frequency Assessment & Plan: Acute, not clearly consistent with kidney stone given no pain and initial trace blood in urine likely due to contaminated  urine sample. Encouraged patient to push fluids, but if symptoms continue follow-up with PCP for reevaluation of urine.  Orders: -     POCT Urinalysis Dipstick (Automated) -     POCT UA - Microscopic Only  Acute cough  Sore throat -     POCT rapid strep A  Strep pharyngitis Assessment & Plan: Current symptoms likely due to strep pharyngitis with possible additional viral infection.  Will treat with amoxicillin 500 mg 2 tablets twice daily for 10 days.  Return and ER precautions provided.     No follow-ups on file.   Eliezer Lofts, MD

## 2022-07-25 NOTE — Assessment & Plan Note (Signed)
Current symptoms likely due to strep pharyngitis with possible additional viral infection.  Will treat with amoxicillin 500 mg 2 tablets twice daily for 10 days.  Return and ER precautions provided.

## 2022-07-25 NOTE — Telephone Encounter (Signed)
Rx for Amoxicillin printed and faxed to [facility] at [phone].

## 2022-07-25 NOTE — Assessment & Plan Note (Signed)
Acute, not clearly consistent with kidney stone given no pain and initial trace blood in urine likely due to contaminated  urine sample. Encouraged patient to push fluids, but if symptoms continue follow-up with PCP for reevaluation of urine.

## 2022-08-18 ENCOUNTER — Ambulatory Visit (INDEPENDENT_AMBULATORY_CARE_PROVIDER_SITE_OTHER): Payer: BC Managed Care – PPO | Admitting: Dermatology

## 2022-08-18 ENCOUNTER — Ambulatory Visit: Payer: BC Managed Care – PPO

## 2022-08-18 DIAGNOSIS — L570 Actinic keratosis: Secondary | ICD-10-CM

## 2022-08-18 MED ORDER — AMINOLEVULINIC ACID HCL 10 % EX GEL
2000.0000 mg | Freq: Once | CUTANEOUS | Status: AC
  Administered 2022-08-18: 2000 mg via TOPICAL

## 2022-08-18 NOTE — Patient Instructions (Signed)
RED LIGHT AMELUZ/PDT Treatment Common Side Effects  - Burning/stinging, which may be severe and last up to 24-72 hours after your treatment  - Redness, swelling and/or peeling which may last up to 4 weeks  - Scaling/crusting which may last up to 2 weeks  - Sun sensitivity (you MUST avoid sun exposure for 48-72 hours after treatment)  Care Instructions  - Okay to wash with soap and water and shampoo as normal  - If needed, you can do a cold compress (ex. Ice packs) for comfort  - If okay with your Primary Doctor, you may use analgesics such as Tylenol every 4-6 hours, not to exceed recommended dose  - You may apply Cerave Healing Ointment, Vaseline or Aquaphor  - If you have a lot of swelling you may take a Benadryl to help with this (this may cause drowsiness)  Sun Precautions  - Wear a wide brim hat for the next week if outside  - Wear a sunblock with zinc or titanium dioxide at least SPF 50 daily   We will recheck you in 10-12 weeks. If any problems, please call the office and ask to speak with a nurse. 

## 2022-08-18 NOTE — Progress Notes (Signed)
Patient completed RED LIGHT PDT therapy with debridement today.  Several breaks were taken during 10 minute treatment as patient was having some discomfort.   1. AK (actinic keratosis) Scalp  Photodynamic therapy - Scalp Procedure discussed: discussed risks, benefits, side effects. and alternatives   Prep: site scrubbed/prepped with acetone   Debridement needed: Yes (performed by Physician with sand paper.  [phone]))   Number of lesions:  Multiple Type of treatment:  Red light Aminolevulinic Acid (see MAR for details): Ameluz Aminolevulinic Acid comment:  [id number] Incubation time (minutes):  120 Number of minutes under lamp:  10 Cooling:  Fan Outcome: patient tolerated procedure well with no complications   Post-procedure details: sunscreen applied and aftercare instructions given to patient    Related Medications Aminolevulinic Acid HCl 10 % GEL 2,000 mg     Abby Foster RMA  I personally debrided area prior to application of aminolevulinic acid  Brendolyn Patty  Documentation: I have reviewed the above documentation for accuracy and completeness, and I agree with the above.  Brendolyn Patty MD

## 2022-08-25 ENCOUNTER — Telehealth: Payer: Self-pay

## 2022-08-25 NOTE — Telephone Encounter (Signed)
PA started in [email or web address] for pantoprazole 40 mg capsules. [id number]; awaiting determination.

## 2022-08-26 NOTE — Telephone Encounter (Signed)
Received notification that PA is not needed.

## 2022-09-02 ENCOUNTER — Other Ambulatory Visit: Payer: Self-pay | Admitting: Family Medicine

## 2022-09-04 DIAGNOSIS — G4733 Obstructive sleep apnea (adult) (pediatric): Secondary | ICD-10-CM | POA: Diagnosis not present

## 2022-10-04 DIAGNOSIS — G4733 Obstructive sleep apnea (adult) (pediatric): Secondary | ICD-10-CM | POA: Diagnosis not present

## 2022-10-28 ENCOUNTER — Ambulatory Visit: Payer: BC Managed Care – PPO | Admitting: Dermatology

## 2022-10-28 ENCOUNTER — Encounter: Payer: Self-pay | Admitting: Dermatology

## 2022-10-28 VITALS — BP 145/85

## 2022-10-28 DIAGNOSIS — W908XXA Exposure to other nonionizing radiation, initial encounter: Secondary | ICD-10-CM

## 2022-10-28 DIAGNOSIS — X32XXXA Exposure to sunlight, initial encounter: Secondary | ICD-10-CM

## 2022-10-28 DIAGNOSIS — L578 Other skin changes due to chronic exposure to nonionizing radiation: Secondary | ICD-10-CM

## 2022-10-28 DIAGNOSIS — L570 Actinic keratosis: Secondary | ICD-10-CM

## 2022-10-28 NOTE — Patient Instructions (Addendum)
Cryotherapy Aftercare  Wash gently with soap and water everyday.   Apply Vaseline and Band-Aid daily until healed.     Due to recent changes in healthcare laws, you may see results of your pathology and/or laboratory studies on MyChart before the doctors have had a chance to review them. We understand that in some cases there may be results that are confusing or concerning to you. Please understand that not all results are received at the same time and often the doctors may need to interpret multiple results in order to provide you with the best plan of care or course of treatment. Therefore, we ask that you please give us 2 business days to thoroughly review all your results before contacting the office for clarification. Should we see a critical lab result, you will be contacted sooner.   If You Need Anything After Your Visit  If you have any questions or concerns for your doctor, please call our main line at [phone] and press option 4 to reach your doctor's medical assistant. If no one answers, please leave a voicemail as directed and we will return your call as soon as possible. Messages left after 4 pm will be answered the following business day.   You may also send us a message via MyChart. We typically respond to MyChart messages within 1-2 business days.  For prescription refills, please ask your pharmacy to contact our office. Our fax number is [phone].  If you have an urgent issue when the clinic is closed that cannot wait until the next business day, you can page your doctor at the number below.    Please note that while we do our best to be available for urgent issues outside of office hours, we are not available 24/7.   If you have an urgent issue and are unable to reach us, you may choose to seek medical care at your doctor's office, retail clinic, urgent care center, or emergency room.  If you have a medical emergency, please immediately call 911 or go to the  emergency department.  Pager Numbers  - Dr. Kowalski: [phone]  - Dr. Moye: [phone]  - Dr. Stewart: [phone]  In the event of inclement weather, please call our main line at [phone] for an update on the status of any delays or closures.  Dermatology Medication Tips: Please keep the boxes that topical medications come in in order to help keep track of the instructions about where and how to use these. Pharmacies typically print the medication instructions only on the boxes and not directly on the medication tubes.   If your medication is too expensive, please contact our office at [phone] option 4 or send us a message through MyChart.   We are unable to tell what your co-pay for medications will be in advance as this is different depending on your insurance coverage. However, we may be able to find a substitute medication at lower cost or fill out paperwork to get insurance to cover a needed medication.   If a prior authorization is required to get your medication covered by your insurance company, please allow us 1-2 business days to complete this process.  Drug prices often vary depending on where the prescription is filled and some pharmacies may offer cheaper prices.  The website www.goodrx.com contains coupons for medications through different pharmacies. The prices here do not account for what the cost may be with help from insurance (it may be cheaper with your insurance), but the website can   give you the price if you did not use any insurance.  - You can print the associated coupon and take it with your prescription to the pharmacy.  - You may also stop by our office during regular business hours and pick up a GoodRx coupon card.  - If you need your prescription sent electronically to a different pharmacy, notify our office through [facility] MyChart or by phone at [phone] option 4.     Si Usted Necesita Algo Despus de Su Visita  Tambin puede  enviarnos un mensaje a travs de MyChart. Por lo general respondemos a los mensajes de MyChart en el transcurso de 1 a 2 das hbiles.  Para renovar recetas, por favor pida a su farmacia que se ponga en contacto con nuestra oficina. Nuestro nmero de fax es el [phone].  Si tiene un asunto urgente cuando la clnica est cerrada y que no puede esperar hasta el siguiente da hbil, puede llamar/localizar a su doctor(a) al nmero que aparece a continuacin.   Por favor, tenga en cuenta que aunque hacemos todo lo posible para estar disponibles para asuntos urgentes fuera del horario de oficina, no estamos disponibles las 24 horas del da, los 7 das de la semana.   Si tiene un problema urgente y no puede comunicarse con nosotros, puede optar por buscar atencin mdica  en el consultorio de su doctor(a), en una clnica privada, en un centro de atencin urgente o en una sala de emergencias.  Si tiene una emergencia mdica, por favor llame inmediatamente al 911 o vaya a la sala de emergencias.  Nmeros de bper  - Dr. Kowalski: [phone]  - Dra. Moye: [phone]  - Dra. Stewart: [phone]  En caso de inclemencias del tiempo, por favor llame a nuestra lnea principal al [phone] para una actualizacin sobre el estado de cualquier retraso o cierre.  Consejos para la medicacin en dermatologa: Por favor, guarde las cajas en las que vienen los medicamentos de uso tpico para ayudarle a seguir las instrucciones sobre dnde y cmo usarlos. Las farmacias generalmente imprimen las instrucciones del medicamento slo en las cajas y no directamente en los tubos del medicamento.   Si su medicamento es muy caro, por favor, pngase en contacto con nuestra oficina llamando al [phone] y presione la opcin 4 o envenos un mensaje a travs de MyChart.   No podemos decirle cul ser su copago por los medicamentos por adelantado ya que esto es diferente dependiendo de la cobertura de su seguro.  Sin embargo, es posible que podamos encontrar un medicamento sustituto a menor costo o llenar un formulario para que el seguro cubra el medicamento que se considera necesario.   Si se requiere una autorizacin previa para que su compaa de seguros cubra su medicamento, por favor permtanos de 1 a 2 das hbiles para completar este proceso.  Los precios de los medicamentos varan con frecuencia dependiendo del lugar de dnde se surte la receta y alguna farmacias pueden ofrecer precios ms baratos.  El sitio web www.goodrx.com tiene cupones para medicamentos de diferentes farmacias. Los precios aqu no tienen en cuenta lo que podra costar con la ayuda del seguro (puede ser ms barato con su seguro), pero el sitio web puede darle el precio si no utiliz ningn seguro.  - Puede imprimir el cupn correspondiente y llevarlo con su receta a la farmacia.  - Tambin puede pasar por nuestra oficina durante el horario de atencin regular y recoger una tarjeta de cupones de GoodRx.  -   Si necesita que su receta se enve electrnicamente a una farmacia diferente, informe a nuestra oficina a travs de MyChart de [facility] o por telfono llamando al [phone] y presione la opcin 4.  

## 2022-10-28 NOTE — Progress Notes (Signed)
   Follow-Up Visit   Subjective  Gregory Valencia is a 57 y.o. male who presents for the following: AK f/u, Red light 03/24 to scalp.  Has good reaction- skin peeled.   The following portions of the chart were reviewed this encounter and updated as appropriate: medications, allergies, medical history  Review of Systems:  No other skin or systemic complaints except as noted in HPI or Assessment and Plan.  Objective  Well appearing patient in no apparent distress; mood and affect are within normal limits.    A focused examination was performed of the following areas: Face, scalp  Relevant exam findings are noted in the Assessment and Plan.  L inf vertex x 2 (2) Pink scaly macules    Assessment & Plan   ACTINIC DAMAGE - chronic, secondary to cumulative UV radiation exposure/sun exposure over time - diffuse scaly erythematous macules with underlying dyspigmentation - Recommend daily broad spectrum sunscreen SPF 30+ to sun-exposed areas, reapply every 2 hours as needed.  - Recommend staying in the shade or wearing long sleeves, sun glasses (UVA+UVB protection) and wide brim hats (4-inch brim around the entire circumference of the hat). - Call for new or changing lesions.  - samples of sunscreen given, CeraVe, Eucerin, La Roche Posay  AK (actinic keratosis) (2) L inf vertex x 2  Good results with Red Light PDT treatment  Destruction of lesion - L inf vertex x 2  Destruction method: cryotherapy   Informed consent: discussed and consent obtained   Lesion destroyed using liquid nitrogen: Yes   Region frozen until ice ball extended beyond lesion: Yes   Outcome: patient tolerated procedure well with no complications   Post-procedure details: wound care instructions given   Additional details:  Prior to procedure, discussed risks of blister formation, small wound, skin dyspigmentation, or rare scar following cryotherapy. Recommend Vaseline ointment to treated areas while healing.      Return for as scheduled for TBSE, Hx of AKs.  I, Ardis Rowan, RMA, am acting as scribe for Willeen Niece, MD .   Documentation: I have reviewed the above documentation for accuracy and completeness, and I agree with the above.  Willeen Niece, MD

## 2022-11-04 DIAGNOSIS — G4733 Obstructive sleep apnea (adult) (pediatric): Secondary | ICD-10-CM | POA: Diagnosis not present

## 2022-11-28 ENCOUNTER — Other Ambulatory Visit: Payer: Self-pay | Admitting: Family Medicine

## 2023-01-21 ENCOUNTER — Ambulatory Visit: Payer: BC Managed Care – PPO | Admitting: Urology

## 2023-01-22 ENCOUNTER — Ambulatory Visit
Admission: RE | Admit: 2023-01-22 | Discharge: 2023-01-22 | Disposition: A | Discharge status: Home / Self Care | Payer: BC Managed Care – PPO | Source: Ambulatory Visit | Attending: Urology | Admitting: Urology

## 2023-01-22 ENCOUNTER — Encounter: Payer: Self-pay | Admitting: Urology

## 2023-01-22 ENCOUNTER — Other Ambulatory Visit: Payer: Self-pay | Admitting: Urology

## 2023-01-22 ENCOUNTER — Ambulatory Visit: Payer: BC Managed Care – PPO | Admitting: Urology

## 2023-01-22 VITALS — BP 135/81 | HR 68 | Ht 73.0 in | Wt 280.0 lb

## 2023-01-22 DIAGNOSIS — Z87442 Personal history of urinary calculi: Secondary | ICD-10-CM

## 2023-01-22 DIAGNOSIS — N200 Calculus of kidney: Secondary | ICD-10-CM

## 2023-01-22 DIAGNOSIS — Z125 Encounter for screening for malignant neoplasm of prostate: Secondary | ICD-10-CM

## 2023-01-22 DIAGNOSIS — Z09 Encounter for follow-up examination after completed treatment for conditions other than malignant neoplasm: Secondary | ICD-10-CM

## 2023-01-22 NOTE — Progress Notes (Signed)
   01/22/2023 3:47 PM   WARWICK ROSELLO Oct 23, 1965 [id number]  Reason for visit: Follow up nephrolithiasis, PSA screening  HPI: 57 year old male who originally presented in April 2021 with gross hematuria and was found to have a 7 mm asymptomatic left distal ureteral stone and underwent ureteroscopy and stone removal.  PSA at that time was 2.0.  He has not had any stone events since that time.  I personally viewed and interpreted the KUB today that shows no definitive evidence of radiopaque stones.  Most recent PSA is stable at 2.3 from March 2023.  We reviewed the AUA guidelines that recommend screening every 2-4 years through age 46.  PCP will continue to draw his PSA.  He denies any problems with gross hematuria, urinary symptoms, or ED.  We discussed general stone prevention strategies including adequate hydration with goal of producing 2.5 L of urine daily, increasing citric acid intake, increasing calcium intake during high oxalate meals, minimizing animal protein, and decreasing salt intake. Information about dietary recommendations given today.   Follow-up with urology as needed   Sondra Come, MD  [facility] [address] [place], [place] [address] [phone]

## 2023-02-24 ENCOUNTER — Other Ambulatory Visit: Payer: Self-pay | Admitting: Family Medicine

## 2023-02-24 NOTE — Telephone Encounter (Signed)
Patient is overdue for CPE; please call patient to schedule.

## 2023-02-25 ENCOUNTER — Other Ambulatory Visit: Payer: Self-pay | Admitting: Family Medicine

## 2023-02-25 DIAGNOSIS — M2550 Pain in unspecified joint: Secondary | ICD-10-CM

## 2023-02-25 DIAGNOSIS — E7849 Other hyperlipidemia: Secondary | ICD-10-CM

## 2023-02-25 NOTE — Telephone Encounter (Signed)
Patient has been scheduled

## 2023-02-25 NOTE — Telephone Encounter (Signed)
LVM for patient to c/b and sched.  

## 2023-02-26 ENCOUNTER — Other Ambulatory Visit: Payer: BC Managed Care – PPO

## 2023-02-26 DIAGNOSIS — M2550 Pain in unspecified joint: Secondary | ICD-10-CM | POA: Diagnosis not present

## 2023-02-26 DIAGNOSIS — E7849 Other hyperlipidemia: Secondary | ICD-10-CM | POA: Diagnosis not present

## 2023-02-26 LAB — COMPREHENSIVE METABOLIC PANEL
ALT: 27 U/L (ref 0–53)
AST: 25 U/L (ref 0–37)
Albumin: 3.9 g/dL (ref 3.5–5.2)
Alkaline Phosphatase: 67 U/L (ref 39–117)
BUN: 11 mg/dL (ref 6–23)
CO2: 28 mEq/L (ref 19–32)
Calcium: 9.4 mg/dL (ref 8.4–10.5)
Chloride: 102 mEq/L (ref 96–112)
Creatinine, Ser: 1.37 mg/dL (ref 0.40–1.50)
GFR: 56.67 mL/min — ABNORMAL LOW (ref >60.00)
Glucose, Bld: 104 mg/dL — ABNORMAL HIGH (ref 70–99)
Potassium: 4 mEq/L (ref 3.5–5.1)
Sodium: 135 mEq/L (ref 135–145)
Total Bilirubin: 0.5 mg/dL (ref 0.2–1.2)
Total Protein: 7.4 g/dL (ref 6.0–8.3)

## 2023-02-26 LAB — LIPID PANEL
Cholesterol: 177 mg/dL (ref 0–200)
HDL: 27.5 mg/dL — ABNORMAL LOW (ref >39.00)
Total CHOL/HDL Ratio: 6
Triglycerides: 552 mg/dL — ABNORMAL HIGH (ref 0.0–149.0)

## 2023-02-26 LAB — URIC ACID: Uric Acid, Serum: 7.3 mg/dL (ref 4.0–7.8)

## 2023-02-26 LAB — LDL CHOLESTEROL, DIRECT: Direct LDL: 82 mg/dL

## 2023-03-05 ENCOUNTER — Encounter: Payer: Self-pay | Admitting: Family Medicine

## 2023-03-05 ENCOUNTER — Ambulatory Visit (INDEPENDENT_AMBULATORY_CARE_PROVIDER_SITE_OTHER): Payer: BC Managed Care – PPO | Admitting: Family Medicine

## 2023-03-05 VITALS — BP 132/82 | HR 86 | Temp 98.3°F | Ht 73.0 in | Wt 277.0 lb

## 2023-03-05 DIAGNOSIS — Z0000 Encounter for general adult medical examination without abnormal findings: Secondary | ICD-10-CM

## 2023-03-05 DIAGNOSIS — E782 Mixed hyperlipidemia: Secondary | ICD-10-CM

## 2023-03-05 DIAGNOSIS — M109 Gout, unspecified: Secondary | ICD-10-CM

## 2023-03-05 DIAGNOSIS — F419 Anxiety disorder, unspecified: Secondary | ICD-10-CM

## 2023-03-05 DIAGNOSIS — G4733 Obstructive sleep apnea (adult) (pediatric): Secondary | ICD-10-CM

## 2023-03-05 DIAGNOSIS — Z7189 Other specified counseling: Secondary | ICD-10-CM

## 2023-03-05 DIAGNOSIS — E7849 Other hyperlipidemia: Secondary | ICD-10-CM

## 2023-03-05 MED ORDER — PANTOPRAZOLE SODIUM 40 MG PO TBEC: DELAYED_RELEASE_TABLET | ORAL | 3 refills | Status: DC

## 2023-03-05 MED ORDER — COLCHICINE 0.6 MG PO TABS: 0.6000 mg | ORAL_TABLET | Freq: Two times a day (BID) | ORAL | 0 refills | Status: AC | PRN

## 2023-03-05 NOTE — Progress Notes (Signed)
CPE- See plan.  Routine anticipatory guidance given to patient.  See health maintenance.  The possibility exists that previously documented standard health maintenance information may have been brought forward from a previous encounter into this note.  If needed, that same information has been updated to reflect the current situation based on today's encounter.    Tetanus 2023 Flu encouraged.  PNA not due Shingles d/w pt.   Covid vaccine encouraged.  D/w pt.   Living will d/w pt.  Wife designated if patient were incapacitated.   Diet and exercise d/w pt.   Colonoscopy 2016 PSA deferred 2024, has been ~2 prev.   [facility] and [facility]    D/w pt about f/u CT chest.  See AVS.  D/w pt about checking with pulmonary.    HLD d/w pt.  Weight is up.  Diet and exercise d/w pt.  He isn't as active in retirement.    OSA on CPAP.  Compliant.    Prev joint pain quickly improved on colchicine.  D/w pt about having a refill on hand if needed.  No recent similar flare.  D/w pt about presumed gout.   Mood d/w pt.  He retired in the meantime.  Buspar prev helped.  He had changes in his dreams with buspar use at night, d/w pt about cessation.  No SI/HI.    PMH and SH reviewed  Meds, vitals, and allergies reviewed.   ROS: Per HPI.  Unless specifically indicated otherwise in HPI, the patient denies:  General: fever. Eyes: acute vision changes ENT: sore throat Cardiovascular: chest pain Respiratory: SOB GI: vomiting GU: dysuria Musculoskeletal: acute back pain Derm: acute rash Neuro: acute motor dysfunction Psych: worsening mood Endocrine: polydipsia Heme: bleeding Allergy: hayfever  GEN: nad, alert and oriented HEENT: mucous membranes moist NECK: supple w/o LA CV: rrr. PULM: ctab, no inc wob ABD: soft, +bs EXT: no edema SKIN: no acute rash

## 2023-03-05 NOTE — Patient Instructions (Addendum)
Please call Bevelyn Ngo, NP Pulmonary/Critical Care Medicine [phone]  Check on follow up CT to see if still eligible.    Recheck labs in about 2-3 months.  Fasting lab visit.    I would get a flu shot each fall.   Take care.  Glad to see you.

## 2023-03-08 DIAGNOSIS — M109 Gout, unspecified: Secondary | ICD-10-CM | POA: Insufficient documentation

## 2023-03-08 NOTE — Assessment & Plan Note (Signed)
No active symptoms. Prev joint pain quickly improved on colchicine.  D/w pt about having a refill on hand if needed.  No recent similar flare.  D/w pt about presumed gout.

## 2023-03-08 NOTE — Assessment & Plan Note (Signed)
Living will d/w pt.  Wife designated if patient were incapacitated.   ?

## 2023-03-08 NOTE — Assessment & Plan Note (Signed)
Tetanus 2023 Flu encouraged.  PNA not due Shingles d/w pt.   Covid vaccine encouraged.  D/w pt.   Living will d/w pt.  Wife designated if patient were incapacitated.   Diet and exercise d/w pt.   Colonoscopy 2016 PSA deferred 2024, has been ~2 prev.   [facility] and HCV screening [facility] 1990[facility].

## 2023-03-08 NOTE — Assessment & Plan Note (Signed)
  OSA on CPAP.  Compliant.  Continue as is.

## 2023-03-08 NOTE — Assessment & Plan Note (Signed)
HLD d/w pt.  Weight is up.  Diet and exercise d/w pt.  He isn't as active in retirement.  Recheck labs in about 2-3 months.  Fasting lab visit.

## 2023-03-08 NOTE — Assessment & Plan Note (Signed)
History of. He retired in the meantime.  Buspar prev helped.  He had changes in his dreams with buspar use at night, d/w pt about cessation.  No SI/HI.  He can update me as needed.

## 2023-03-18 DIAGNOSIS — G4733 Obstructive sleep apnea (adult) (pediatric): Secondary | ICD-10-CM | POA: Diagnosis not present

## 2023-03-25 ENCOUNTER — Telehealth: Payer: Self-pay

## 2023-03-25 NOTE — Telephone Encounter (Signed)
Spoke with pt and clarified that since he quit smoking in 2007 he will no longer qualify for lung cancer screening. PT verbalized understanding.

## 2023-03-25 NOTE — Telephone Encounter (Signed)
Patient is calling in reference to lung cancer screening program. He stopped smoking over 15 years ago and was unsure if he still qualified for the program. His PCP wanted him to check and see if he still qualified as well.

## 2023-04-18 DIAGNOSIS — G4733 Obstructive sleep apnea (adult) (pediatric): Secondary | ICD-10-CM | POA: Diagnosis not present

## 2023-05-08 ENCOUNTER — Telehealth: Payer: Self-pay | Admitting: Family Medicine

## 2023-05-08 ENCOUNTER — Telehealth: Payer: BC Managed Care – PPO | Admitting: Family Medicine

## 2023-05-08 DIAGNOSIS — U071 COVID-19: Secondary | ICD-10-CM | POA: Diagnosis not present

## 2023-05-08 MED ORDER — PROMETHAZINE-DM 6.25-15 MG/5ML PO SYRP: 5.0000 mL | ORAL_SOLUTION | Freq: Four times a day (QID) | ORAL | 0 refills | Status: AC | PRN

## 2023-05-08 MED ORDER — NIRMATRELVIR/RITONAVIR (PAXLOVID) TABLET (RENAL DOSING): 2.0000 | ORAL_TABLET | Freq: Two times a day (BID) | ORAL | 0 refills | Status: AC

## 2023-05-08 NOTE — Telephone Encounter (Signed)
Pt called stating he was prescribed  nirmatrelvir/ritonavir, renal dosing, (PAXLOVID) 10 x 150 MG & 10 x 100MG  TABS by Carlena Sax during e visit. Pt states the pharmacy told him with insurance, the meds will be about $1500. Pt asked if another rx can be prescribed, that'll be cheaper? Please advise. Call back # [phone].

## 2023-05-08 NOTE — Progress Notes (Signed)
Virtual Visit Consent   Gregory Valencia, you are scheduled for a virtual visit with a [facility] provider today. Just as with appointments in the office, your consent must be obtained to participate. Your consent will be active for this visit and any virtual visit you may have with one of our providers in the next 365 days. If you have a MyChart account, a copy of this consent can be sent to you electronically.  As this is a virtual visit, video technology does not allow for your provider to perform a traditional examination. This may limit your provider's ability to fully assess your condition. If your provider identifies any concerns that need to be evaluated in person or the need to arrange testing (such as labs, EKG, etc.), we will make arrangements to do so. Although advances in technology are sophisticated, we cannot ensure that it will always work on either your end or our end. If the connection with a video visit is poor, the visit may have to be switched to a telephone visit. With either a video or telephone visit, we are not always able to ensure that we have a secure connection.  By engaging in this virtual visit, you consent to the provision of healthcare and authorize for your insurance to be billed (if applicable) for the services provided during this visit. Depending on your insurance coverage, you may receive a charge related to this service.  I need to obtain your verbal consent now. Are you willing to proceed with your visit today? [name] has provided verbal consent on 05/08/2023 for a virtual visit (video or telephone). Georgana Curio, FNP  Date: 05/08/2023 3:04 PM  Virtual Visit via Video Note   I, Georgana Curio, connected with  Gregory Valencia  ([id number], December 15, 1965) on 05/08/23 at  3:00 PM EST by a video-enabled telemedicine application and verified that I am speaking with the correct person using two identifiers.  Location: Patient: Virtual Visit Location Patient:  Home Provider: Virtual Visit Location Provider: Home Office   I discussed the limitations of evaluation and management by telemedicine and the availability of in person appointments. The patient expressed understanding and agreed to proceed.    History of Present Illness: Gregory Valencia is a 57 y.o. who identifies as a male who was assigned male at birth, and is being seen today for covid positive testing with sx starting Wednesday. Cough, head congestion, fatigue, no fever, no wheezing or sob. Marland Kitchen  HPI: HPI  Problems:  Patient Active Problem List   Diagnosis Date Noted   Gout, unspecified 03/08/2023   Urinary frequency 07/25/2022   Achilles tenosynovitis 05/14/2022   Otalgia of left ear 04/16/2022   Anxiety 02/05/2022   Pelvic lymphadenopathy 08/17/2020   Healthcare maintenance 06/07/2020   OSA (obstructive sleep apnea) 06/07/2020   Snoring 03/28/2020   Gross hematuria 09/29/2019   Routine general medical examination at a health care facility 07/01/2017   Advance care planning 07/01/2017   FH: CAD (coronary artery disease) 07/01/2017   Lactose intolerance in adult 06/03/2017   Skin lesion 08/04/2013   Mixed hyperlipidemia 07/29/2007   GERD 07/29/2007    Allergies: No Known Allergies Medications:  Current Outpatient Medications:    nirmatrelvir/ritonavir, renal dosing, (PAXLOVID) 10 x 150 MG & 10 x 100MG  TABS, Take 2 tablets by mouth 2 (two) times daily for 5 days. (Take nirmatrelvir 150 mg one tablet twice daily for 5 days and ritonavir 100 mg one tablet twice daily for 5 days)  Patient GFR is 56.67, Disp: 20 tablet, Rfl: 0   promethazine-dextromethorphan (PROMETHAZINE-DM) 6.25-15 MG/5ML syrup, Take 5 mLs by mouth 4 (four) times daily as needed for up to 10 days for cough., Disp: 118 mL, Rfl: 0   colchicine 0.6 MG tablet, Take 1 tablet (0.6 mg total) by mouth 2 (two) times daily as needed (for joint pain)., Disp: 60 tablet, Rfl: 0   Multiple Vitamin (MULTI VITAMIN DAILY PO), Take 1  tablet by mouth daily., Disp: , Rfl:    pantoprazole (PROTONIX) 40 MG tablet, TAKE ONE TABLET EACH MORNING BEFORE BREAKFAST, Disp: 90 tablet, Rfl: 3  Observations/Objective: Patient is well-developed, well-nourished in no acute distress.  Resting comfortably  at home.  Head is normocephalic, atraumatic.  No labored breathing.  Speech is clear and coherent with logical content.  Patient is alert and oriented at baseline.    Assessment and Plan: There are no diagnoses linked to this encounter. Increase fluids, quarantine disucssed, uc if sx persist or worsen, tylenol or ibuprofen as directed, MVI with VITD and zinc.   Follow Up Instructions: I discussed the assessment and treatment plan with the patient. The patient was provided an opportunity to ask questions and all were answered. The patient agreed with the plan and demonstrated an understanding of the instructions.  A copy of instructions were sent to the patient via MyChart unless otherwise noted below.     The patient was advised to call back or seek an in-person evaluation if the symptoms worsen or if the condition fails to improve as anticipated.    Georgana Curio, FNP

## 2023-05-08 NOTE — Patient Instructions (Signed)

## 2023-05-09 ENCOUNTER — Encounter: Payer: Self-pay | Admitting: Family Medicine

## 2023-05-09 MED ORDER — MOLNUPIRAVIR EUA 200MG CAPSULE: 4.0000 | ORAL_CAPSULE | Freq: Two times a day (BID) | ORAL | 0 refills | Status: AC

## 2023-05-09 NOTE — Addendum Note (Signed)
Addended by: Georgana Curio on: 05/09/2023 12:52 PM   Modules accepted: Orders

## 2023-05-18 DIAGNOSIS — G4733 Obstructive sleep apnea (adult) (pediatric): Secondary | ICD-10-CM | POA: Diagnosis not present

## 2023-06-19 ENCOUNTER — Ambulatory Visit (INDEPENDENT_AMBULATORY_CARE_PROVIDER_SITE_OTHER): Payer: BC Managed Care – PPO | Admitting: Primary Care

## 2023-06-19 VITALS — BP 136/84 | HR 82 | Temp 97.3°F | Ht 73.0 in | Wt 287.0 lb

## 2023-06-19 DIAGNOSIS — J069 Acute upper respiratory infection, unspecified: Secondary | ICD-10-CM | POA: Diagnosis not present

## 2023-06-19 LAB — POC COVID19 BINAXNOW: SARS Coronavirus 2 Ag: NEGATIVE

## 2023-06-19 NOTE — Assessment & Plan Note (Signed)
Symptoms and presentation today representative of viral etiology. Negative COVID-19 test in the office today.  Exam overall reassuring today.  Discussed conservative treatment and when to report if symptoms have progressed. Discussed over-the-counter treatment.  Follow-up as needed.

## 2023-06-19 NOTE — Addendum Note (Signed)
Addended by: Lonia Blood on: 06/19/2023 10:38 AM   Modules accepted: Orders

## 2023-06-19 NOTE — Patient Instructions (Signed)
You can try a few things over the counter to help with your symptoms including:  Cough: Delsym or Robitussin (get the off brand, works just as well) Chest Congestion: Mucinex (plain) Nasal Congestion/Ear Pressure/Sinus Pressure: Try using Flonase (fluticasone) nasal spray. Instill 1 spray in each nostril twice daily. This can be purchased over the counter. Body aches, fevers, headache: Ibuprofen (not to exceed 2400 mg in 24 hours) or Acetaminophen-Tylenol (not to exceed 3000 mg in 24 hours) Runny Nose/Throat Drainage/Sneezing/Itchy or Watery Eyes: An antihistamine such as Zyrtec, Claritin, Xyzal, Allegra  You should be feeling better by day seven of symptoms, but please do contact me if this is not the case.  It was a pleasure to see you today!  
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
## 2023-07-21 ENCOUNTER — Encounter: Payer: Self-pay | Admitting: Dermatology

## 2023-07-21 ENCOUNTER — Ambulatory Visit: Payer: BC Managed Care – PPO | Admitting: Dermatology

## 2023-07-21 DIAGNOSIS — D229 Melanocytic nevi, unspecified: Secondary | ICD-10-CM

## 2023-07-21 DIAGNOSIS — C44311 Basal cell carcinoma of skin of nose: Secondary | ICD-10-CM | POA: Diagnosis not present

## 2023-07-21 DIAGNOSIS — L738 Other specified follicular disorders: Secondary | ICD-10-CM

## 2023-07-21 DIAGNOSIS — L820 Inflamed seborrheic keratosis: Secondary | ICD-10-CM | POA: Diagnosis not present

## 2023-07-21 DIAGNOSIS — C4491 Basal cell carcinoma of skin, unspecified: Secondary | ICD-10-CM

## 2023-07-21 DIAGNOSIS — Z85828 Personal history of other malignant neoplasm of skin: Secondary | ICD-10-CM

## 2023-07-21 DIAGNOSIS — L578 Other skin changes due to chronic exposure to nonionizing radiation: Secondary | ICD-10-CM

## 2023-07-21 DIAGNOSIS — D492 Neoplasm of unspecified behavior of bone, soft tissue, and skin: Secondary | ICD-10-CM | POA: Diagnosis not present

## 2023-07-21 DIAGNOSIS — Z1283 Encounter for screening for malignant neoplasm of skin: Secondary | ICD-10-CM | POA: Diagnosis not present

## 2023-07-21 DIAGNOSIS — D1801 Hemangioma of skin and subcutaneous tissue: Secondary | ICD-10-CM

## 2023-07-21 DIAGNOSIS — L570 Actinic keratosis: Secondary | ICD-10-CM

## 2023-07-21 DIAGNOSIS — W908XXA Exposure to other nonionizing radiation, initial encounter: Secondary | ICD-10-CM

## 2023-07-21 DIAGNOSIS — L821 Other seborrheic keratosis: Secondary | ICD-10-CM

## 2023-07-21 DIAGNOSIS — L814 Other melanin hyperpigmentation: Secondary | ICD-10-CM

## 2023-07-21 HISTORY — DX: Basal cell carcinoma of skin, unspecified: C44.91

## 2023-07-21 NOTE — Patient Instructions (Addendum)
 Cryotherapy Aftercare  Wash gently with soap and water everyday.   Apply Vaseline Jelly daily until healed.    Wound Care Instructions  Cleanse wound gently with soap and water once a day then pat dry with clean gauze. Apply a thin coat of Petrolatum (petroleum jelly, "Vaseline") over the wound (unless you have an allergy to this). We recommend that you use a new, sterile tube of Vaseline. Do not pick or remove scabs. Do not remove the yellow or white "healing tissue" from the base of the wound.  Cover the wound with fresh, clean, nonstick gauze and secure with paper tape. You may use Band-Aids in place of gauze and tape if the wound is small enough, but would recommend trimming much of the tape off as there is often too much. Sometimes Band-Aids can irritate the skin.  You should call the office for your biopsy report after 1 week if you have not already been contacted.  If you experience any problems, such as abnormal amounts of bleeding, swelling, significant bruising, significant pain, or evidence of infection, please call the office immediately.  FOR ADULT SURGERY PATIENTS: If you need something for pain relief you may take 1 extra strength Tylenol (acetaminophen) AND 2 Ibuprofen (200mg  each) together every 4 hours as needed for pain. (do not take these if you are allergic to them or if you have a reason you should not take them.) Typically, you may only need pain medication for 1 to 3 days.      Recommend daily broad spectrum sunscreen SPF 30+ to sun-exposed areas, reapply every 2 hours as needed. Call for new or changing lesions.  Staying in the shade or wearing long sleeves, sun glasses (UVA+UVB protection) and wide brim hats (4-inch brim around the entire circumference of the hat) are also recommended for sun protection.    Melanoma ABCDEs  Melanoma is the most dangerous type of skin cancer, and is the leading cause of death from skin disease.  You are more likely to develop  melanoma if you: Have light-colored skin, light-colored eyes, or red or blond hair Spend a lot of time in the sun Tan regularly, either outdoors or in a tanning bed Have had blistering sunburns, especially during childhood Have a close family member who has had a melanoma Have atypical moles or large birthmarks  Early detection of melanoma is key since treatment is typically straightforward and cure rates are extremely high if we catch it early.   The first sign of melanoma is often a change in a mole or a new dark spot.  The ABCDE system is a way of remembering the signs of melanoma.  A for asymmetry:  The two halves do not match. B for border:  The edges of the growth are irregular. C for color:  A mixture of colors are present instead of an even brown color. D for diameter:  Melanomas are usually (but not always) greater than 6mm - the size of a pencil eraser. E for evolution:  The spot keeps changing in size, shape, and color.  Please check your skin once per month between visits. You can use a small mirror in front and a large mirror behind you to keep an eye on the back side or your body.   If you see any new or changing lesions before your next follow-up, please call to schedule a visit.  Please continue daily skin protection including broad spectrum sunscreen SPF 30+ to sun-exposed areas, reapplying every 2 hours  as needed when you're outdoors.   Staying in the shade or wearing long sleeves, sun glasses (UVA+UVB protection) and wide brim hats (4-inch brim around the entire circumference of the hat) are also recommended for sun protection.     Due to recent changes in healthcare laws, you may see results of your pathology and/or laboratory studies on MyChart before the doctors have had a chance to review them. We understand that in some cases there may be results that are confusing or concerning to you. Please understand that not all results are received at the same time and often  the doctors may need to interpret multiple results in order to provide you with the best plan of care or course of treatment. Therefore, we ask that you please give [place] 2 business days to thoroughly review all your results before contacting the office for clarification. Should we see a critical lab result, you will be contacted sooner.   If You Need Anything After Your Visit  If you have any questions or concerns for your doctor, please call our main line at [phone] and press option 4 to reach your doctor's medical assistant. If no one answers, please leave a voicemail as directed and we will return your call as soon as possible. Messages left after 4 pm will be answered the following business day.   You may also send [place] a message via MyChart. We typically respond to MyChart messages within 1-2 business days.  For prescription refills, please ask your pharmacy to contact our office. Our fax number is [phone].  If you have an urgent issue when the clinic is closed that cannot wait until the next business day, you can page your doctor at the number below.    Please note that while we do our best to be available for urgent issues outside of office hours, we are not available 24/7.   If you have an urgent issue and are unable to reach [place], you may choose to seek medical care at your doctor's office, retail clinic, urgent care center, or emergency room.  If you have a medical emergency, please immediately call 911 or go to the emergency department.  Pager Numbers  - Dr. Gwen Pounds: [phone]  - Dr. Roseanne Reno: [phone]  - Dr. Katrinka Blazing: [phone]   In the event of inclement weather, please call our main line at [phone] for an update on the status of any delays or closures.  Dermatology Medication Tips: Please keep the boxes that topical medications come in in order to help keep track of the instructions about where and how to use these. Pharmacies typically print the medication  instructions only on the boxes and not directly on the medication tubes.   If your medication is too expensive, please contact our office at [phone] option 4 or send [place] a message through MyChart.   We are unable to tell what your co-pay for medications will be in advance as this is different depending on your insurance coverage. However, we may be able to find a substitute medication at lower cost or fill out paperwork to get insurance to cover a needed medication.   If a prior authorization is required to get your medication covered by your insurance company, please allow [place] 1-2 business days to complete this process.  Drug prices often vary depending on where the prescription is filled and some pharmacies may offer cheaper prices.  The website www.goodrx.com contains coupons for medications through different pharmacies. The prices here do not account  for what the cost may be with help from insurance (it may be cheaper with your insurance), but the website can give you the price if you did not use any insurance.  - You can print the associated coupon and take it with your prescription to the pharmacy.  - You may also stop by our office during regular business hours and pick up a GoodRx coupon card.  - If you need your prescription sent electronically to a different pharmacy, notify our office through [facility] or by phone at [phone] option 4.     Si Usted Necesita Algo Despus de Su Visita  Tambin puede enviarnos un mensaje a travs de Clinical cytogeneticist. Por lo general respondemos a los mensajes de MyChart en el transcurso de 1 a 2 das hbiles.  Para renovar recetas, por favor pida a su farmacia que se ponga en contacto con nuestra oficina. Annie Sable de fax es [place] [phone].  Si tiene un asunto urgente cuando la clnica est cerrada y que no puede esperar hasta el siguiente da hbil, puede llamar/localizar a su doctor(a) al nmero que aparece a continuacin.   Por  favor, tenga en cuenta que aunque hacemos todo lo posible para estar disponibles para asuntos urgentes fuera del horario de [place], no estamos disponibles las 24 horas del da, los 7 [address]  [address] de la [place].   Si tiene un problema urgente y no puede comunicarse con nosotros, puede optar por buscar atencin mdica  en el consultorio de su doctor(a), en una clnica privada, en un centro de atencin urgente o en una sala de emergencias.  Si tiene Engineer, drilling, por favor llame inmediatamente al 911 o vaya a la sala de emergencias.  Nmeros de bper  - Dr. Gwen Pounds: [phone]  - Dra. Roseanne Reno: [phone]  - Dr. Katrinka Blazing: [phone]   En caso de inclemencias del tiempo, por favor llame a Lacy Duverney principal al [phone] para una actualizacin sobre el [place] de cualquier retraso o cierre.  Consejos para la medicacin en dermatologa: Por favor, guarde las cajas en las que vienen los medicamentos de uso tpico para ayudarle a seguir las instrucciones sobre dnde y cmo usarlos. Las farmacias generalmente imprimen las instrucciones del medicamento slo en las cajas y no directamente en los tubos del [place].   Si su medicamento es muy caro, por favor, pngase en contacto con Rolm Gala llamando al [phone] y presione la opcin 4 o envenos un mensaje a travs de Clinical cytogeneticist.   No podemos decirle cul ser su copago por los medicamentos por adelantado ya que esto es diferente dependiendo de la cobertura de su seguro. Sin embargo, es posible que podamos encontrar un medicamento sustituto a Audiological scientist un formulario para que el seguro cubra el medicamento que se considera necesario.   Si se requiere una autorizacin previa para que su compaa de seguros [place] su medicamento, por favor permtanos de 1 a 2 das hbiles para completar [address]  Los precios de los medicamentos varan con frecuencia dependiendo del Environmental consultant de dnde se surte la receta y alguna farmacias  pueden ofrecer precios ms baratos.  El sitio web www.goodrx.com tiene cupones para medicamentos de Health and safety inspector. Los precios aqu no tienen en cuenta lo que podra costar con la ayuda del seguro (puede ser ms barato con su seguro), pero el sitio web puede darle el precio si no utiliz Tourist information centre manager.  - Puede imprimir el cupn correspondiente y llevarlo con su receta a la farmacia.  -  Tambin puede pasar por nuestra oficina durante el horario de atencin regular y Education officer, museum una tarjeta de cupones de GoodRx.  - Si necesita que su receta se enve electrnicamente a una farmacia diferente, informe a nuestra oficina a travs de MyChart de [place] o por telfono llamando al [phone] y presione la opcin 4.

## 2023-07-21 NOTE — Progress Notes (Signed)
 Follow-Up Visit   Subjective  Gregory Valencia is a 58 y.o. male who presents for the following: Skin Cancer Screening and Upper Body Skin Exam. Hx of SCC. Hx of AKs.   Hx of PDT treatment to scalp 08/2022. States it burned during treatment and was painful during healing process. Scalp feels a lot smoother now.  Spot of concern on nose. Thought was a pimple. Gets crusted, peels of and comes back. Sensitive to touch. Bleeds easily. Dur: 3-5 months.  Lesions at upper chest/neck. Rubbed, irritated by clothing.   The patient presents for Upper Body Skin Exam (UBSE) for skin cancer screening and mole check. The patient has spots, moles and lesions to be evaluated, some may be new or changing and the patient may have concern these could be cancer.    The following portions of the chart were reviewed this encounter and updated as appropriate: medications, allergies, medical history  Review of Systems:  No other skin or systemic complaints except as noted in HPI or Assessment and Plan.  Objective  Well appearing patient in no apparent distress; mood and affect are within normal limits.  All skin waist up and legs examined. Relevant physical exam findings are noted in the Assessment and Plan.  occipital scalp x3, L upper temple x1 (4) Erythematous thin papules/macules with gritty scale.  Left Tip of Nose 4 mm pink pearly papule   upper chest x2 (2) Erythematous keratotic or waxy stuck-on papule  Assessment & Plan   AK (ACTINIC KERATOSIS) (4) occipital scalp x3, L upper temple x1 (4) Actinic keratoses are precancerous spots that appear secondary to cumulative UV radiation exposure/sun exposure over time. They are chronic with expected duration over 1 year. A portion of actinic keratoses will progress to squamous cell carcinoma of the skin. It is not possible to reliably predict which spots will progress to skin cancer and so treatment is recommended to prevent development of skin  cancer.  Recommend daily broad spectrum sunscreen SPF 30+ to sun-exposed areas, reapply every 2 hours as needed.  Recommend staying in the shade or wearing long sleeves, sun glasses (UVA+UVB protection) and wide brim hats (4-inch brim around the entire circumference of the hat). Call for new or changing lesions. Destruction of lesion - occipital scalp x3, L upper temple x1 (4)  Destruction method: cryotherapy   Informed consent: discussed and consent obtained   Lesion destroyed using liquid nitrogen: Yes   Region frozen until ice ball extended beyond lesion: Yes   Outcome: patient tolerated procedure well with no complications   Post-procedure details: wound care instructions given   Additional details:  Prior to procedure, discussed risks of blister formation, small wound, skin dyspigmentation, or rare scar following cryotherapy. Recommend Vaseline ointment to treated areas while healing.  NEOPLASM OF SKIN Left Tip of Nose Epidermal / dermal shaving  Lesion diameter (cm):  0.4 Informed consent: discussed and consent obtained   Patient was prepped and draped in usual sterile fashion: Area prepped with alcohol. Anesthesia: the lesion was anesthetized in a standard fashion   Anesthetic:  1% lidocaine w/ epinephrine 1-100,000 buffered w/ 8.4% NaHCO3 Instrument used: flexible razor blade   Hemostasis achieved with: pressure, aluminum chloride and electrodesiccation   Outcome: patient tolerated procedure well    Destruction of lesion  Destruction method: electrodesiccation and curettage   Informed consent: discussed and consent obtained   Curettage performed in three different directions: Yes   Electrodesiccation performed over the curetted area: Yes   Final  wound size (cm):  0.4 Hemostasis achieved with:  pressure, aluminum chloride and electrodesiccation Outcome: patient tolerated procedure well with no complications   Post-procedure details: wound care instructions given    Additional details:  Mupirocin ointment and Bandaid applied  Specimen 1 - Surgical pathology Differential Diagnosis: R/O BCC  Check Margins: No  EDC Today Discussed Mohs vs EDC if + skin cancer. Patient prefers EDC today. Advised if recurs will require Mohs surgery.  INFLAMED SEBORRHEIC KERATOSIS (2) upper chest x2 (2) Symptomatic, irritating, patient would like treated. Destruction of lesion - upper chest x2 (2)  Destruction method: cryotherapy   Informed consent: discussed and consent obtained   Lesion destroyed using liquid nitrogen: Yes   Region frozen until ice ball extended beyond lesion: Yes   Outcome: patient tolerated procedure well with no complications   Post-procedure details: wound care instructions given   Additional details:  Prior to procedure, discussed risks of blister formation, small wound, skin dyspigmentation, or rare scar following cryotherapy. Recommend Vaseline ointment to treated areas while healing.   Skin cancer screening performed today.  HISTORY OF SQUAMOUS CELL CARCINOMA OF THE SKIN. Left lateral thigh. 2015. - No evidence of recurrence today - Recommend regular full body skin exams - Recommend daily broad spectrum sunscreen SPF 30+ to sun-exposed areas, reapply every 2 hours as needed.  - Call if any new or changing lesions are noted between office visits  Actinic Damage - Chronic condition, secondary to cumulative UV/sun exposure - diffuse scaly erythematous macules with underlying dyspigmentation - Recommend daily broad spectrum sunscreen SPF 30+ to sun-exposed areas, reapply every 2 hours as needed.  - Staying in the shade or wearing long sleeves, sun glasses (UVA+UVB protection) and wide brim hats (4-inch brim around the entire circumference of the hat) are also recommended for sun protection.  - Call for new or changing lesions.  Lentigines, Seborrheic Keratoses, Hemangiomas - Benign normal skin lesions - Benign-appearing - Call for any  changes  Melanocytic Nevi - Tan-brown and/or pink-flesh-colored symmetric macules and papules - Benign appearing on exam today - Observation - Call clinic for new or changing moles - Recommend daily use of broad spectrum spf 30+ sunscreen to sun-exposed areas.   Sebaceous Hyperplasia - Small yellow papules with a central dell at face - Benign-appearing - Observe. Call for changes.   STUCCO KERATOSES (type of seborrheic keratosis) - Stuck-on, white keratotic papules at B/L feet and ankles  - Benign-appearing - Discussed benign etiology and prognosis. - Observe - Call for any changes  Return in about 1 year (around 07/20/2024) for TBSE, HxSCC; 6 months recheck biopsy site.  I, Lawson Radar, CMA, am acting as scribe for Willeen Niece, MD.   Documentation: I have reviewed the above documentation for accuracy and completeness, and I agree with the above.  Willeen Niece, MD

## 2023-07-22 LAB — SURGICAL PATHOLOGY

## 2023-07-27 ENCOUNTER — Telehealth: Payer: Self-pay

## 2023-07-27 NOTE — Telephone Encounter (Signed)
 Advised pt of bx result/sh ?

## 2023-07-27 NOTE — Telephone Encounter (Signed)
-----   Message from Willeen Niece sent at 07/27/2023 12:43 PM EST ----- 1. Skin, left tip of nose :       BASAL CELL CARCINOMA, NODULAR PATTERN   BCC skin cancer- already treated with EDC at time of biopsy, recheck site in 6 mos   - please call patient

## 2023-09-16 ENCOUNTER — Encounter: Payer: Self-pay | Admitting: Primary Care

## 2023-09-16 ENCOUNTER — Ambulatory Visit: Admitting: Primary Care

## 2023-09-16 ENCOUNTER — Ambulatory Visit: Payer: Self-pay

## 2023-09-16 VITALS — BP 134/78 | HR 73 | Temp 98.1°F | Ht 73.0 in | Wt 292.0 lb

## 2023-09-16 DIAGNOSIS — R221 Localized swelling, mass and lump, neck: Secondary | ICD-10-CM | POA: Diagnosis not present

## 2023-09-16 DIAGNOSIS — R09A2 Foreign body sensation, throat: Secondary | ICD-10-CM | POA: Diagnosis not present

## 2023-09-16 MED ORDER — PREDNISONE 20 MG PO TABS
ORAL_TABLET | ORAL | 0 refills | Status: DC
Start: 2023-09-16 — End: 2023-10-01

## 2023-09-16 NOTE — Assessment & Plan Note (Addendum)
 Exam today without evidence of airway compromise.  Discussed common causes for this including silent reflux, post nasal drip. Continue pantoprazole 40 mg daily.   Stop Claritin. Start Xyzal 5 mg HS.   Referral placed to ENT.

## 2023-09-16 NOTE — Telephone Encounter (Signed)
 Chief Complaint: swelling in throat Symptoms: swelling in throat, post nasal drip Frequency: 3 months Pertinent Negatives: Patient denies difficulty breathing Disposition: [] ED /[] Urgent Care (no appt availability in office) / [x] Appointment(In office/virtual)/ []  [facility]/ [] Home Care/ [] Refused Recommended Disposition /[] [facility]/ []  Follow-up with PCP Additional Notes: Pt states that he has been having like a swollen throat for about 3 months denies pain.  States he has like constant drainage in the back of his throat and it can interfere with his breathing. States that he is having to increase his cpap at night due to this. States the would get white balls in back of throat on glands and would normally be able to get them out but not this time.   Copied from CRM [id number]. Topic: Clinical - Red Word Triage >> Sep 16, 2023  8:39 AM Kita Perish H wrote: [facility] that prompted transfer to Nurse Triage: Throat issue feels like something in back of throat and effecting sleep at night, back of throat swollen and can't really see back there anymore, gurgling in back of throat. Has Cpap machine having 10-15 events at night, before had 1 or 2 at night. Reason for Disposition  [1] MILD longstanding difficulty breathing AND [2]  SAME as normal  Answer Assessment - Initial Assessment Questions 1. RESPIRATORY STATUS: "Describe your breathing?" (e.g., wheezing, shortness of breath, unable to speak, severe coughing)      Shortness of breath especially at night  2. ONSET: "When did this breathing problem begin?"      2-3 month 3. PATTERN "Does the difficult breathing come and go, or has it been constant since it started?"      Comes and goes 4. SEVERITY: "How bad is your breathing?" (e.g., mild, moderate, severe)    - MILD: No SOB at rest, mild SOB with walking, speaks normally in sentences, can lie down, no retractions, pulse < 100.    - MODERATE: SOB at rest, SOB with minimal  exertion and prefers to sit, cannot lie down flat, speaks in phrases, mild retractions, audible wheezing, pulse 100-120.    - SEVERE: Very SOB at rest, speaks in single words, struggling to breathe, sitting hunched forward, retractions, pulse > 120      mild 5. RECURRENT SYMPTOM: "Have you had difficulty breathing before?" If Yes, ask: "When was the last time?" and "What happened that time?"      months  7. LUNG HISTORY: "Do you have any history of lung disease?"  (e.g., pulmonary embolus, asthma, emphysema)     Sleep apnea 8. CAUSE: "What do you think is causing the breathing problem?"      Drainage in throat and now swelling 9. OTHER SYMPTOMS: "Do you have any other symptoms? (e.g., dizziness, runny nose, cough, chest pain, fever)     Post nasal drainage and swelling  Protocols used: Breathing Difficulty-A-AH

## 2023-09-16 NOTE — Assessment & Plan Note (Signed)
 Without airway compromise.   Start prednisone 20 mg tablets. Take 2 tablets by mouth once daily in the morning for 5 days.  Referral placed to ENT.

## 2023-09-16 NOTE — Patient Instructions (Signed)
 You will either be contacted via phone regarding your referral to ENT, or you may receive a letter on your MyChart portal from our referral team with instructions for scheduling an appointment. Please let us  know if you have not been contacted by anyone within two weeks.  Start prednisone 20 mg tablets. Take 2 tablets by mouth once daily in the morning for 5 days.  Stop taking Alevert for allergies. Start taking levocetirizine (Xyzal) 5 mg at bedtime.  You can find this over-the-counter.  It was a pleasure to see you today!
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
## 2023-09-17 NOTE — Telephone Encounter (Addendum)
 Noted seen in office yesterday by Polly Brink.

## 2023-09-22 DIAGNOSIS — G4733 Obstructive sleep apnea (adult) (pediatric): Secondary | ICD-10-CM | POA: Diagnosis not present

## 2023-09-22 DIAGNOSIS — R0989 Other specified symptoms and signs involving the circulatory and respiratory systems: Secondary | ICD-10-CM | POA: Diagnosis not present

## 2023-09-22 DIAGNOSIS — R0982 Postnasal drip: Secondary | ICD-10-CM | POA: Diagnosis not present

## 2023-09-29 ENCOUNTER — Telehealth: Payer: Self-pay

## 2023-09-29 NOTE — Telephone Encounter (Signed)
 Copied from CRM [id number]. Topic: Clinical - Medical Advice >> Sep 29, 2023 11:16 AM [name] wrote: Reason for CRM: patient called and stated that he went to [facility], & the doctor stated he would send over documents about the visit along with a his suggestions for the patient's CPAP. He would like to speak with a nurse regarding what his next steps should be because his doctor suggested having the cpap air levels raised which is different from his usual. Please call back and advise.

## 2023-09-30 NOTE — Telephone Encounter (Signed)
 Please have him call pulmonary, who is managing his CPAP.  Thanks.

## 2023-09-30 NOTE — Telephone Encounter (Signed)
 Document is in the system from ENT. I did advise patient that you are out of office today and I will reach back to him once I hear from you. Patient understood

## 2023-09-30 NOTE — Telephone Encounter (Signed)
 Spoke with patient and he will call pulmonary about his CPAP.

## 2023-10-01 ENCOUNTER — Encounter: Payer: Self-pay | Admitting: Internal Medicine

## 2023-10-01 ENCOUNTER — Ambulatory Visit: Payer: Self-pay

## 2023-10-01 ENCOUNTER — Ambulatory Visit: Admitting: Internal Medicine

## 2023-10-01 VITALS — BP 132/84 | HR 83 | Temp 98.0°F | Ht 74.0 in | Wt 293.4 lb

## 2023-10-01 DIAGNOSIS — G4733 Obstructive sleep apnea (adult) (pediatric): Secondary | ICD-10-CM

## 2023-10-01 NOTE — Patient Instructions (Signed)
 Order- DME [employer]- please change autopap range to 5-20. Also please face-to-face to refit mask of choice for comfort and seal.  As discussed, your weight gain may be an important part of the problem you are noticing. If you keep feeling something isn't right in your throat, ask Dr Vallarie Gauze I'm imaging, possibly a modified barium swallow with speech therapy consultation, would be helpful.

## 2023-10-01 NOTE — Progress Notes (Signed)
 10/01/23- 57 yoM former smoker with hx OSA, complicated by GERD HST 06/02/20- AHI 31.3/hr, det to 84%, body weight 256 lbs CPAP auto 5-15/ [facility]   ordered 06/07/20 Body weight today-293 lbs Download compliance- 100%, AHI 7.1/hr Epworth score-5 -----Snoring, frequent awakenings and waking gasping for air.  Currently wearing CPAP.  Former patient of Dr. Matilde Son.  Sleep Study 05/2020. Discussed the use of AI scribe software for clinical note transcription with the patient, who gave verbal consent to proceed.  History of Present Illness   Gregory Valencia is a 58 year old male with sleep apnea who presents with concerns about CPAP settings and persistent tiredness. He had seen [facility] with endoscopic evaluation for evaluation of a globus sensation, with suggestion that maybe pressure needed to be increased.  He experiences persistent tiredness and gasping for air at night despite nightly CPAP use. The CPAP is set between five and fifteen centimeters of water pressure, with about seven apnea events per hour. He reports a 'gurgling' sensation in the throat in the mornings, which began in January, unrelated to pollen. He has difficulty dislodging tonsil stones recently.  He quit smoking in 2007. Since retiring, he gained weight, now weighing 293 pounds, up from 256 pounds during his 2022 sleep study. He feels more tired and has reduced physical activity due to increased fatigue.  No trouble swallowing, but he feels like he is not getting a full swallow and senses something blocking his airway. No breathing problems related to pollen, though he experiences itchy eyes during pollen season.     Assessment and Plan:    Obstructive Sleep Apnea Current CPAP settings result in 7 apnea events per hour. Goal is to reduce events to below 5 per hour. ENT recommended increasing CPAP pressure range to 20 cm H2O. Significant weight gain may contribute to symptoms. Discussed potential for BiPAP titration study if CPAP  adjustments do not achieve desired results. - Instruct home care company to adjust CPAP settings to allow pressure range up to 20 cm H2O. - Ensure home care company refits CPAP mask if current mask is uncomfortable. - Consider BiPAP titration study if CPAP adjustments do not achieve desired results.  Weight gain Weight gain of approximately 40 pounds since January 2024, contributing to increased soft tissue thickness and potentially exacerbating obstructive sleep apnea symptoms. - Discuss weight management strategies with primary care provider, Dr. Vallarie Gauze.   Globus- He will follow up with Dr Vallarie Gauze     ROS-see HPI   + = positive Constitutional:    weight loss, night sweats, fevers, chills, fatigue, lassitude. HEENT:    headaches, difficulty swallowing, tooth/dental problems, sore throat,       sneezing, itching, ear ache, nasal congestion, post nasal drip, snoring CV:    chest pain, orthopnea, PND, swelling in lower extremities, anasarca,                                   dizziness, palpitations Resp:   shortness of breath with exertion or at rest.                productive cough,   non-productive cough, coughing up of blood.              change in color of mucus.  wheezing.   Skin:    rash or lesions. GI:  No-   heartburn, indigestion, abdominal pain, nausea, vomiting, diarrhea,  change in bowel habits, loss of appetite GU: dysuria, change in color of urine, no urgency or frequency.   flank pain. MS:   joint pain, stiffness, decreased range of motion, back pain. Neuro-     nothing unusual Psych:  change in mood or affect.  depression or anxiety.   memory loss.  OBJ- Physical Exam General- Alert, Oriented, Affect-appropriate, Distress- none acute, +big man, +obese Skin- rash-none, lesions- none, excoriation- none Lymphadenopathy- none Head- atraumatic            Eyes- Gross vision intact, PERRLA, conjunctivae and secretions clear            Ears- Hearing,  canals-normal            Nose- Clear, no-Septal dev, mucus, polyps, erosion, perforation             Throat- Mallampati IV , mucosa clear , drainage- none, tonsils- atrophic, +teeth Neck- flexible , trachea midline, no stridor , thyroid nl, carotid no bruit Chest - symmetrical excursion , unlabored           Heart/CV- RRR , no murmur , no gallop  , no rub, nl s1 s2                           - JVD- none , edema- none, stasis changes- none, varices- none           Lung- clear to P&A, wheeze- none, cough- none , dullness-none, rub- none           Chest wall-  Abd-  Br/ Gen/ Rectal- Not done, not indicated Extrem- cyanosis- none, clubbing, none, atrophy- none, strength- nl Neuro- grossly intact to observation

## 2023-10-01 NOTE — Telephone Encounter (Deleted)
 Copied from CRM [id number]. Topic: Clinical - Red Word Triage >> Oct 01, 2023 10:06 AM Juliana Ocean wrote: Red Word that prompted transfer to Nurse Triage: feels like his CPAP not working right.  Waking up SOB, tired.  Having 06-15/hr a night,  before it was 1-2

## 2023-10-21 ENCOUNTER — Ambulatory Visit: Admitting: Pulmonary Disease

## 2023-11-02 DIAGNOSIS — G4733 Obstructive sleep apnea (adult) (pediatric): Secondary | ICD-10-CM | POA: Diagnosis not present

## 2023-11-03 ENCOUNTER — Ambulatory Visit: Admitting: Family Medicine

## 2023-11-03 ENCOUNTER — Encounter: Payer: Self-pay | Admitting: Family Medicine

## 2023-11-03 VITALS — BP 132/72 | HR 61 | Temp 98.3°F | Ht 74.0 in | Wt 286.4 lb

## 2023-11-03 DIAGNOSIS — R09A2 Foreign body sensation, throat: Secondary | ICD-10-CM

## 2023-11-03 DIAGNOSIS — R21 Rash and other nonspecific skin eruption: Secondary | ICD-10-CM | POA: Diagnosis not present

## 2023-11-03 DIAGNOSIS — L989 Disorder of the skin and subcutaneous tissue, unspecified: Secondary | ICD-10-CM | POA: Diagnosis not present

## 2023-11-03 NOTE — Progress Notes (Unsigned)
 Rash BLE.  ~1 month.  Improving, fading in the meantime.  He had reddish lesions that were macular.  Not painful.  That didn't itch initially but did at the end of last week.  Not on the arms.  Not on palms or soles.  Only ankle to knee B distrubution.  Noted after going to outdoor festival.  Was wearing shorts and low cut socks.  No one else with similar.    Cyst in L groin.  It prev drained.    Throat sx, had ENT eval.  D/w pt about CPAP.  He got a new mask yesterday.  He had pulmonary follow up in the meantime.  Sleeping better in the meantime.  Last night was the first night with the larger mask.  D/w pt about getting adequate tx for OSA and then seeing what sx resolved.    No known tick bite.    Meds, vitals, and allergies reviewed.   ROS: Per HPI unless specifically indicated in ROS section   Healing boil site on the L groin w/o flunctuant mass.   Blanching punctate lesions on the B lower shins.

## 2023-11-03 NOTE — Patient Instructions (Addendum)
 I would keep using warm compresses on your groin.  That should resolve.  If bigger or more pain then let me know.   The rash should gradually fade in.  If not, then let me know.    Keep using CPAP and update pulmonary if that isn't helping.   Take care.  Glad to see you.

## 2023-11-04 DIAGNOSIS — R21 Rash and other nonspecific skin eruption: Secondary | ICD-10-CM | POA: Insufficient documentation

## 2023-11-04 NOTE — Assessment & Plan Note (Signed)
 Of unclear source, potential environmental exposure.  Overall reassuring exam, improving.  The rash should gradually fade in.  If not, then let me know.  Would defer extra workup at this point.

## 2023-11-04 NOTE — Assessment & Plan Note (Signed)
 Looks like he has a resolving boil. I would keep using warm compresses on your groin.  That should resolve.  If bigger or more pain then let me know.  He agrees to plan.

## 2023-11-04 NOTE — Assessment & Plan Note (Signed)
 He had ENT eval.  Discussed that I do not expect his symptoms to improve until he has sleep apnea adequately addressed.  I asked him to keep using CPAP and update pulmonary if that isn't helping.

## 2023-12-31 NOTE — Progress Notes (Unsigned)
 10/01/23- 57 yoM former smoker with hx OSA, complicated by GERD HST 06/02/20- AHI 31.3/hr, det to 84%, body weight 256 lbs CPAP auto 5-15/ [facility]   ordered 06/07/20 Body weight today-293 lbs Download compliance- 100%, AHI 7.1/hr Epworth score-5 -----Snoring, frequent awakenings and waking gasping for air.  Currently wearing CPAP.  Former patient of Dr. Shellia.  Sleep Study 05/2020. Discussed the use of AI scribe software for clinical note transcription with the patient, who gave verbal consent to proceed.  History of Present Illness   Gregory Valencia is a 58 year old male with sleep apnea who presents with concerns about CPAP settings and persistent tiredness. He had seen [facility] with endoscopic evaluation for evaluation of a globus sensation, with suggestion that maybe pressure needed to be increased.  He experiences persistent tiredness and gasping for air at night despite nightly CPAP use. The CPAP is set between five and fifteen centimeters of water pressure, with about seven apnea events per hour. He reports a 'gurgling' sensation in the throat in the mornings, which began in January, unrelated to pollen. He has difficulty dislodging tonsil stones recently.  He quit smoking in 2007. Since retiring, he gained weight, now weighing 293 pounds, up from 256 pounds during his 2022 sleep study. He feels more tired and has reduced physical activity due to increased fatigue.  No trouble swallowing, but he feels like he is not getting a full swallow and senses something blocking his airway. No breathing problems related to pollen, though he experiences itchy eyes during pollen season.     Assessment and Plan:    Obstructive Sleep Apnea Current CPAP settings result in 7 apnea events per hour. Goal is to reduce events to below 5 per hour. ENT recommended increasing CPAP pressure range to 20 cm H2O. Significant weight gain may contribute to symptoms. Discussed potential for BiPAP titration study if CPAP  adjustments do not achieve desired results. - Instruct home care company to adjust CPAP settings to allow pressure range up to 20 cm H2O. - Ensure home care company refits CPAP mask if current mask is uncomfortable. - Consider BiPAP titration study if CPAP adjustments do not achieve desired results.  Weight gain Weight gain of approximately 40 pounds since January 2024, contributing to increased soft tissue thickness and potentially exacerbating obstructive sleep apnea symptoms. - Discuss weight management strategies with primary care provider, Dr. Cleatus.  Globus- He will follow up with Dr Cleatus     01/01/24- 57 yoM former smoker with hx OSA, complicated by GERD HST 06/02/20- AHI 31.3/hr, det to 84%, body weight 256 lbs CPAP auto 5-20/ [facility]   ordered 06/07/20 Body weight today-289 lbs                              Download compliance- 100%, AHI 5.3/hr Discussed the use of AI scribe software for clinical note transcription with the patient, who gave verbal consent to proceed.  History of Present Illness   Gregory Valencia is a 58 year old male with sleep apnea who presents with persistent gland swelling and CPAP issues. Wife is here  Persistent submandibular gland swelling since January feels like it is obstructing his throat and airway at times. A recent root canal was performed due to an infection at the root of a tooth. An ENT evaluation, including nasal endoscopy, showed no sinus infection or other concerning findings. No other lumps or swelling are present. He is  working with his PCP on this issue. Anticipate soft tissue CT, ultrasound or biopsy eventually.  Sleep apnea has improved with CPAP use, reducing apnea events to around 5 per hour. He experiences discomfort with strong air pressure, especially in the first couple of hours. He switched to a medium mask due to leakage issues. Sleep quality has improved, and he is not experiencing significant discomfort with current pressure settings,   but would like to try a slight pressure reduction.  We will see how he does changing to 5-18.     Assessment and Plan:    Obstructive sleep apnea Improved control on CPAP therapy with reduced apneas. Current settings cause occasional discomfort and mask fit issues. - Adjust CPAP pressure range to 5-18 cm H2O. - Coordinate with [employer] for different mask options. - Schedule follow-up in one year with sleep doctor in [place] for convenience.   Obesity -encourage diet/ exercise   Adenopathy - submandibular node? Being managed by PCP       ROS-see HPI   + = positive Constitutional:    weight loss, night sweats, fevers, chills, fatigue, lassitude. HEENT:    headaches, difficulty swallowing, tooth/dental problems, sore throat,       sneezing, itching, ear ache, nasal congestion, post nasal drip, snoring CV:    chest pain, orthopnea, PND, swelling in lower extremities, anasarca,                                   dizziness, palpitations Resp:   shortness of breath with exertion or at rest.                productive cough,   non-productive cough, coughing up of blood.              change in color of mucus.  wheezing.   Skin:    rash or lesions. GI:  No-   heartburn, indigestion, abdominal pain, nausea, vomiting, diarrhea,                 change in bowel habits, loss of appetite GU: dysuria, change in color of urine, no urgency or frequency.   flank pain. MS:   joint pain, stiffness, decreased range of motion, back pain. Neuro-     nothing unusual Psych:  change in mood or affect.  depression or anxiety.   memory loss.  OBJ- Physical Exam General- Alert, Oriented, Affect-appropriate, Distress- none acute, +big man, +obese Skin- rash-none, lesions- none, excoriation- none Lymphadenopathy- none Head- atraumatic            Eyes- Gross vision intact, PERRLA, conjunctivae and secretions clear            Ears- Hearing, canals-normal            Nose- Clear, no-Septal dev, mucus, polyps,  erosion, perforation             Throat- Mallampati IV , mucosa clear , drainage- none, tonsils- atrophic, +teeth Neck- flexible , trachea midline, no stridor , thyroid nl, carotid no bruit Chest - symmetrical excursion , unlabored           Heart/CV- RRR , no murmur , no gallop  , no rub, nl s1 s2                           - JVD- none , edema- none, stasis changes- none, varices- none  Lung- clear to P&A, wheeze- none, cough- none , dullness-none, rub- none           Chest wall-  Abd-  Br/ Gen/ Rectal- Not done, not indicated Extrem- cyanosis- none, clubbing, none, atrophy- none, strength- nl Neuro- grossly intact to observation

## 2024-01-01 ENCOUNTER — Encounter: Payer: Self-pay | Admitting: Internal Medicine

## 2024-01-01 ENCOUNTER — Ambulatory Visit: Admitting: Internal Medicine

## 2024-01-01 VITALS — BP 166/97 | HR 62 | Temp 98.0°F | Ht 74.0 in | Wt 289.4 lb

## 2024-01-01 DIAGNOSIS — G4733 Obstructive sleep apnea (adult) (pediatric): Secondary | ICD-10-CM | POA: Diagnosis not present

## 2024-01-01 NOTE — Patient Instructions (Signed)
 Order- DME [employer]- please change autopap range to 5-18

## 2024-01-18 ENCOUNTER — Ambulatory Visit: Payer: BC Managed Care – PPO | Admitting: Dermatology

## 2024-01-18 DIAGNOSIS — L820 Inflamed seborrheic keratosis: Secondary | ICD-10-CM | POA: Diagnosis not present

## 2024-01-18 DIAGNOSIS — L821 Other seborrheic keratosis: Secondary | ICD-10-CM

## 2024-01-18 DIAGNOSIS — W908XXA Exposure to other nonionizing radiation, initial encounter: Secondary | ICD-10-CM

## 2024-01-18 DIAGNOSIS — C44311 Basal cell carcinoma of skin of nose: Secondary | ICD-10-CM

## 2024-01-18 DIAGNOSIS — L578 Other skin changes due to chronic exposure to nonionizing radiation: Secondary | ICD-10-CM

## 2024-01-18 DIAGNOSIS — Z85828 Personal history of other malignant neoplasm of skin: Secondary | ICD-10-CM

## 2024-01-18 DIAGNOSIS — D692 Other nonthrombocytopenic purpura: Secondary | ICD-10-CM | POA: Diagnosis not present

## 2024-01-18 MED ORDER — TRIAMCINOLONE ACETONIDE 0.1 % EX CREA: TOPICAL_CREAM | CUTANEOUS | 1 refills | Status: AC

## 2024-01-18 NOTE — Progress Notes (Signed)
 Follow-Up Visit   Subjective  Gregory Valencia is a 58 y.o. male who presents for the following:  6 month follow up on bx proven bcc at left tip of nose treated with [facility], patient reports he is concerned it may be coming back has noticed a bump forming in area along with some bleeding.  Also reports some spots at left scalp, right thigh- get irritated by cap/clothes.  Also has some rough bumps at b/l lower legs that would like checked.  Also has spotty purple rash on lower legs, no symptoms, comes up after up on legs for a long time.  Comes and goes.   The following portions of the chart were reviewed this encounter and updated as appropriate: medications, allergies, medical history  Review of Systems:  No other skin or systemic complaints except as noted in HPI or Assessment and Plan.  Objective  Well appearing patient in no apparent distress; mood and affect are within normal limits.   A focused examination was performed of the following areas: Left tip of nose, left scalp, right thigh, b/l lower legs   Relevant exam findings are noted in the Assessment and Plan.                 Left Tip of Nose 0.7 mm pink pearly papule, BCC recurrence confirmed with dermoscopy- no further biopsy needed, photos today  left temporal scalp x 1, right anterior thigh x 1 (2) Erythematous stuck-on, waxy papule or plaque  Assessment & Plan    SEBORRHEIC KERATOSIS - Stuck-on, waxy, tan-brown papules and/or plaques legs - Benign-appearing - Discussed benign etiology and prognosis. - Observe - Call for any changes   PIGMENTED PURPURA vs LCV ON B/L LOWER LEGS  Exam: multiple purpuric macules on lower legs b/l  see photos, Not painful or itchy   Chronic and persistent condition with duration or expected duration over one year. Condition is bothersome for patient. Currently flared.   Treatment Plan: Benign-appearing. Asymptomatic, Observe  Discussed could be caused by leg  swelling, more physical activity (pt notices flares when on feet all day)  Recommend compression stockings, especially when doing physical activity  Start tmc 0.1 % cream apply qd/bid prn to affected areas of lower leg up to 4 weeks, then d/c. 80 g 1 rf Avoid applying to face, groin, and axilla. Use as directed. Long-term use can cause thinning of the skin.  Topical steroids (such as triamcinolone , fluocinolone, fluocinonide, mometasone, clobetasol, halobetasol, betamethasone, hydrocortisone) can cause thinning and lightening of the skin if they are used for too long in the same area. Your physician has selected the right strength medicine for your problem and area affected on the body. Please use your medication only as directed by your physician to prevent side effects.   Discussed if doesn't improve may consider bx to r/o vasculitis    ACTINIC DAMAGE - chronic, secondary to cumulative UV radiation exposure/sun exposure over time - diffuse scaly erythematous macules with underlying dyspigmentation - Recommend daily broad spectrum sunscreen SPF 30+ to sun-exposed areas, reapply every 2 hours as needed.  - Recommend staying in the shade or wearing long sleeves, sun glasses (UVA+UVB protection) and wide brim hats (4-inch brim around the entire circumference of the hat). - Call for new or changing lesions.   HISTORY OF SQUAMOUS CELL CARCINOMA OF THE SKIN - No evidence of recurrence today - Recommend regular full body skin exams - Recommend daily broad spectrum sunscreen SPF 30+ to sun-exposed areas, reapply  every 2 hours as needed.  - Call if any new or changing lesions are noted between office visits    BASAL CELL CARCINOMA (BCC) OF LEFT NASAL TIP Left Tip of Nose Recurrent BCC at left tip of nose   Recommend Moh's surgery since recurrent, discussed with patient in office today, given Moh's pamphlet to take home.  Will refer to Dr. Paci.  Refer to previous pathology bx proven BCC  nodular pattern at left tip of nose treated with ED&C at time of bx  07/21/2023 Accession: [id number] See previous photos in media 07/21/2023  Related Procedures Ambulatory referral to Dermatology INFLAMED SEBORRHEIC KERATOSIS (2) left temporal scalp x 1, right anterior thigh x 1 (2) Symptomatic, irritating, patient would like treated. Destruction of lesion - left temporal scalp x 1, right anterior thigh x 1 (2)  Destruction method: cryotherapy   Informed consent: discussed and consent obtained   Lesion destroyed using liquid nitrogen: Yes   Region frozen until ice ball extended beyond lesion: Yes   Outcome: patient tolerated procedure well with no complications   Post-procedure details: wound care instructions given   Additional details:  Prior to procedure, discussed risks of blister formation, small wound, skin dyspigmentation, or rare scar following cryotherapy. Recommend Vaseline ointment to treated areas while healing.   PIGMENTED PURPURA (HCC)   Related Medications triamcinolone  cream (KENALOG ) 0.1 % Apply qd/bid to affected areas of lower legs as needed. Avoid applying to face, groin, and axilla. Use as directed.  Return for keep follow up in february 2026.  I, Eleanor Blush, CMA, am acting as scribe for Rexene Rattler, MD.   Documentation: I have reviewed the above documentation for accuracy and completeness, and I agree with the above.  Rexene Rattler, MD

## 2024-01-18 NOTE — Patient Instructions (Addendum)
 For bumps at back of legs  Recommend wear compression stockings when outside doing yard work or walking  Start triamcinolone  0.1 % cream - apply topically once to  twice daily to affected bumps at legs daily as needed for 1 month. Avoid applying to face, groin, and axilla. Use as directed. Long-term use can cause thinning of the skin.  Topical steroids (such as triamcinolone , fluocinolone, fluocinonide, mometasone, clobetasol, halobetasol, betamethasone, hydrocortisone) can cause thinning and lightening of the skin if they are used for too long in the same area. Your physician has selected the right strength medicine for your problem and area affected on the body. Please use your medication only as directed by your physician to prevent side effects.   Will consider bx if cream doesn't help     Seborrheic Keratosis  What causes seborrheic keratoses? Seborrheic keratoses are harmless, common skin growths that first appear during adult life.  As time goes by, more growths appear.  Some people may develop a large number of them.  Seborrheic keratoses appear on both covered and uncovered body parts.  They are not caused by sunlight.  The tendency to develop seborrheic keratoses can be inherited.  They vary in color from skin-colored to gray, brown, or even black.  They can be either smooth or have a rough, warty surface.   Seborrheic keratoses are superficial and look as if they were stuck on the skin.  Under the microscope this type of keratosis looks like layers upon layers of skin.  That is why at times the top layer may seem to fall off, but the rest of the growth remains and re-grows.    Treatment Seborrheic keratoses do not need to be treated, but can easily be removed in the office.  Seborrheic keratoses often cause symptoms when they rub on clothing or jewelry.  Lesions can be in the way of shaving.  If they become inflamed, they can cause itching, soreness, or burning.  Removal of a  seborrheic keratosis can be accomplished by freezing, burning, or surgery. If any spot bleeds, scabs, or grows rapidly, please return to have it checked, as these can be an indication of a skin cancer.   Cryotherapy Aftercare  Wash gently with soap and water everyday.   Apply Vaseline and Band-Aid daily until healed.     Due to recent changes in healthcare laws, you may see results of your pathology and/or laboratory studies on MyChart before the doctors have had a chance to review them. We understand that in some cases there may be results that are confusing or concerning to you. Please understand that not all results are received at the same time and often the doctors may need to interpret multiple results in order to provide you with the best plan of care or course of treatment. Therefore, we ask that you please give us  2 business days to thoroughly review all your results before contacting the office for clarification. Should we see a critical lab result, you will be contacted sooner.   If You Need Anything After Your Visit  If you have any questions or concerns for your doctor, please call our main line at [phone] and press option 4 to reach your doctor's medical assistant. If no one answers, please leave a voicemail as directed and we will return your call as soon as possible. Messages left after 4 pm will be answered the following business day.   You may also send us  a message via MyChart. We typically  respond to MyChart messages within 1-2 business days.  For prescription refills, please ask your pharmacy to contact our office. Our fax number is [phone].  If you have an urgent issue when the clinic is closed that cannot wait until the next business day, you can page your doctor at the number below.    Please note that while we do our best to be available for urgent issues outside of office hours, we are not available 24/7.   If you have an urgent issue and are unable to  reach us , you may choose to seek medical care at your doctor's office, retail clinic, urgent care center, or emergency room.  If you have a medical emergency, please immediately call 911 or go to the emergency department.  Pager Numbers  - Dr. Hester: [phone]  - Dr. Jackquline: [phone]  - Dr. Claudene: [phone]   In the event of inclement weather, please call our main line at [phone] for an update on the status of any delays or closures.  Dermatology Medication Tips: Please keep the boxes that topical medications come in in order to help keep track of the instructions about where and how to use these. Pharmacies typically print the medication instructions only on the boxes and not directly on the medication tubes.   If your medication is too expensive, please contact our office at [phone] option 4 or send us  a message through MyChart.   We are unable to tell what your co-pay for medications will be in advance as this is different depending on your insurance coverage. However, we may be able to find a substitute medication at lower cost or fill out paperwork to get insurance to cover a needed medication.   If a prior authorization is required to get your medication covered by your insurance company, please allow us  1-2 business days to complete this process.  Drug prices often vary depending on where the prescription is filled and some pharmacies may offer cheaper prices.  The website www.goodrx.com contains coupons for medications through different pharmacies. The prices here do not account for what the cost may be with help from insurance (it may be cheaper with your insurance), but the website can give you the price if you did not use any insurance.  - You can print the associated coupon and take it with your prescription to the pharmacy.  - You may also stop by our office during regular business hours and pick up a GoodRx coupon card.  - If you need your  prescription sent electronically to a different pharmacy, notify our office through [facility] or by phone at [phone] option 4.     Si Usted Necesita Algo Despus de Su Visita  Tambin puede enviarnos un mensaje a travs de Clinical cytogeneticist. Por lo general respondemos a los mensajes de MyChart en el transcurso de 1 a 2 das hbiles.  Para renovar recetas, por favor pida a su farmacia que se ponga en contacto con nuestra oficina. Randi lakes de fax es [place] [phone].  Si tiene un asunto urgente cuando la clnica est cerrada y que no puede esperar hasta el siguiente da hbil, puede llamar/localizar a su doctor(a) al nmero que aparece a continuacin.   Por favor, tenga en cuenta que aunque hacemos todo lo posible para estar disponibles para asuntos urgentes fuera del horario de [place], no estamos disponibles las 24 horas del da, los 7 [address]  [address] de la [place].   Si tiene un problema urgente y no puede comunicarse  con nosotros, puede optar por buscar atencin mdica  en el consultorio de su doctor(a), en una clnica privada, en un centro de atencin urgente o en una sala de emergencias.  Si tiene Engineer, drilling, por favor llame inmediatamente al 911 o vaya a la sala de emergencias.  Nmeros de bper  - Dr. Hester: [phone]  - Dra. Jackquline: [phone]  - Dr. Claudene: [phone]   En caso de inclemencias del tiempo, por favor llame a landry capes principal al [phone] para una actualizacin sobre el [place] de cualquier retraso o cierre.  Consejos para la medicacin en dermatologa: Por favor, guarde las cajas en las que vienen los medicamentos de uso tpico para ayudarle a seguir las instrucciones sobre dnde y cmo usarlos. Las farmacias generalmente imprimen las instrucciones del medicamento slo en las cajas y no directamente en los tubos del [place].   Si su medicamento es muy caro, por favor, pngase en contacto con landry rieger llamando al  [phone] y presione la opcin 4 o envenos un mensaje a travs de Clinical cytogeneticist.   No podemos decirle cul ser su copago por los medicamentos por adelantado ya que esto es diferente dependiendo de la cobertura de su seguro. Sin embargo, es posible que podamos encontrar un medicamento sustituto a Audiological scientist un formulario para que el seguro cubra el medicamento que se considera necesario.   Si se requiere una autorizacin previa para que su compaa de seguros malta su medicamento, por favor permtanos de 1 a 2 das hbiles para completar este proceso.  Los precios de los medicamentos varan con frecuencia dependiendo del Environmental consultant de dnde se surte la receta y alguna farmacias pueden ofrecer precios ms baratos.  El sitio web www.goodrx.com tiene cupones para medicamentos de Health and safety inspector. Los precios aqu no tienen en cuenta lo que podra costar con la ayuda del seguro (puede ser ms barato con su seguro), pero el sitio web puede darle el precio si no utiliz Tourist information centre manager.  - Puede imprimir el cupn correspondiente y llevarlo con su receta a la farmacia.  - Tambin puede pasar por nuestra oficina durante el horario de atencin regular y Education officer, museum una tarjeta de cupones de GoodRx.  - Si necesita que su receta se enve electrnicamente a una farmacia diferente, informe a nuestra oficina a travs de MyChart de [place] o por telfono llamando al [phone] y presione la opcin 4.

## 2024-02-03 ENCOUNTER — Encounter: Payer: Self-pay | Admitting: Dermatology

## 2024-02-06 DIAGNOSIS — G4733 Obstructive sleep apnea (adult) (pediatric): Secondary | ICD-10-CM | POA: Diagnosis not present

## 2024-02-09 ENCOUNTER — Ambulatory Visit: Admitting: Dermatology

## 2024-02-09 ENCOUNTER — Encounter: Payer: Self-pay | Admitting: Dermatology

## 2024-02-09 VITALS — Temp 98.2°F

## 2024-02-09 DIAGNOSIS — C44311 Basal cell carcinoma of skin of nose: Secondary | ICD-10-CM | POA: Diagnosis not present

## 2024-02-09 DIAGNOSIS — C4491 Basal cell carcinoma of skin, unspecified: Secondary | ICD-10-CM

## 2024-02-09 MED ORDER — MUPIROCIN 2 % EX OINT: 1.0000 | TOPICAL_OINTMENT | Freq: Two times a day (BID) | CUTANEOUS | 0 refills | Status: DC

## 2024-02-09 NOTE — Patient Instructions (Signed)

## 2024-02-09 NOTE — Progress Notes (Signed)
 Follow-Up Visit   Subjective  Gregory Valencia is a 58 y.o. male who presents for the following: Mohs for a nodular basal cell carcinoma on the left tip of nose, referred by Dr. Jackquline. Patient is accompanied by his wife.   Lesion was treated with ED&C at the time of the initial biopsy  The following portions of the chart were reviewed this encounter and updated as appropriate: medications, allergies, medical history  Review of Systems:  No other skin or systemic complaints except as noted in HPI or Assessment and Plan.  Objective  Well appearing patient in no apparent distress; mood and affect are within normal limits.  A focused examination was performed of the following areas: Left tip of nose Relevant physical exam findings are noted in the Assessment and Plan.   Left Tip of Nose Pearly pink papule   Assessment & Plan   BASAL CELL CARCINOMA (BCC), UNSPECIFIED SITE Left Tip of Nose Mohs surgery  Consent obtained: written  Anticoagulation: Is the patient taking prescription anticoagulant and/or aspirin prescribed/recommended by a physician? No   Was the anticoagulation regimen changed prior to Mohs? No    Anesthesia: Anesthesia method: local infiltration Local anesthetic: lidocaine  1% WITH epi  Procedure Details: Timeout: pre-procedure verification complete Procedure Prep: patient was prepped and draped in usual sterile fashion Prep type: chlorhexidine  Biopsy accession number: [id number] Biopsy lab: [facility] Date of biopsy: 07/21/2023 Frozen section biopsy performed: No   Specimen debulked: No   Pre-Op diagnosis: basal cell carcinoma BCC subtype: nodular MohsAIQ Surgical site (if tumor spans multiple areas, please select predominant area): nose Surgery side: left Surgical site (from skin exam): Left Tip of Nose Pre-operative length (cm): 0.5 Pre-operative width (cm): 0.5 Indications for Mohs surgery: anatomic location where tissue conservation is  critical Previously treated? Yes   Previous treatment type: curettage and destruction  Micrographic Surgery Details: Post-operative length (cm): 1.2 Post-operative width (cm): 1 Number of Mohs stages: 2 Cumulative additional sections past 5 per stage: 0 Post surgery depth of defect: dermis  Stage 1    Tumor features identified on Mohs section: basal carcinoma  Stage 2    Tumor features identified on Mohs section: no tumor identified  Patient tolerance of procedure: tolerated well, no immediate complications  Reconstruction: Was the defect reconstructed?: No (Possible delayed repair scheduled for 14-21 days.)    Opioids: Did the patient receive a prescription for opioid/narcotic related to Mohs surgery?: No    Antibiotics: Does patient meet AHA guidelines for endocarditis?: No   Does patient meet AHA guidelines for orthopedic prophylaxis?: No   Were antibiotics given on the day of surgery?: No   Did surgery breach mucosa, expose cartilage/bone, involve an area of lymphedema/inflamed/infected tissue? No      Return in about 2 weeks (around 02/23/2024) for surgery- delayed graft 14-21 days. .   02/09/2024  HISTORY OF PRESENT ILLNESS  Gregory Valencia is seen in consultation at the request of Dr. Jackquline for biopsy-proven Nodular Basal Cell Carcinoma on the left nasal tip. They note that the area has been present for about 8 months increasing in size with time.  There is no history of previous treatment.  Reports no other new or changing lesions and has no other complaints today.  Medications and allergies: see patient chart.  Review of systems: Reviewed 8 systems and notable for the above skin cancer.  All other systems reviewed are unremarkable/negative, unless noted in the HPI. Past medical history, surgical history, family history,  social history were also reviewed and are noted in the chart/questionnaire.    PHYSICAL EXAMINATION  General: Well-appearing, in no acute  distress, alert and oriented x 4. Vitals reviewed in chart (if available).   Skin: Exam reveals a 0.5 x 0.5 cm erythematous papule and biopsy scar on the left nasal tip. There are rhytids, telangiectasias, and lentigines, consistent with photodamage.  Biopsy report(s) reviewed, confirming the diagnosis.   ASSESSMENT  1) Nodular Basal Cell Carcinoma on the left nasal tip 2) photodamage 3) solar lentigines   PLAN   1. Due to location, size, histology, or recurrence and the likelihood of subclinical extension as well as the need to conserve normal surrounding tissue, the patient was deemed acceptable for Mohs micrographic surgery (MMS).  The nature and purpose of the procedure, associated benefits and risks including recurrence and scarring, possible complications such as pain, infection, and bleeding, and alternative methods of treatment if appropriate were discussed with the patient during consent. The lesion location was verified by the patient, by reviewing previous notes, pathology reports, and by photographs as well as angulation measurements if available.  Informed consent was reviewed and signed by the patient, and timeout was performed at 8:30 AM. See op note below.  2. For the photodamage and solar lentigines, sun protection discussed/information given on OTC sunscreens, and we recommend continued regular follow-up with primary dermatologist every 6 months or sooner for any growing, bleeding, or changing lesions. 3. Prognosis and future surveillance discussed. 4. Letter with treatment outcome sent to referring provider. 5. Pain acetaminophen /ibuprofen   MOHS MICROGRAPHIC SURGERY AND RECONSTRUCTION  Initial size:   0.5 x 0.5 cm Surgical defect/wound size: 1.2 x 1.0 cm Anesthesia:    0.33% lidocaine  with 1:200,000 epinephrine  EBL:    <5 mL Complications:  None Repair type:   Delayed Graft  Stages: 2  STAGE I: Anesthesia achieved with 0.5% lidocaine  with 1:200,000 epinephrine .  ChloraPrep applied. 1 section(s) excised using Mohs technique (this includes total peripheral and deep tissue margin excision and evaluation with frozen sections, excised and interpreted by the same physician). The tumor was first debulked and then excised with an approx. 2 mm margin.  Hemostasis was achieved with electrocautery as needed.  The specimen was then oriented, subdivided/relaxed, inked, and processed using Mohs technique.    Frozen section analysis revealed a positive margin for islands of cells with peripheral palisading and a haphazard arrangement of the more central cells in the peripheral and deep margin.    STAGE II: An additional 2 mm margin was excised.  Hemostasis was achieved with electrocautery as needed.  The specimen was then oriented, subdivided/relaxed, inked, and processed using Mohs technique. Evaluation of slides by the Mohs surgeon revealed clear tumor margins.   Reconstruction  Delayed Skin Graft  The decision to delay the skin graft for the reconstruction of the Mohs wound until one week post-procedure is based on the goal of allowing the wound to undergo secondary intention healing. This approach facilitates the gradual granulation of tissue and the reduction of wound depth, which can improve the overall quality and viability of the eventual graft site. By allowing the wound to heal partially through secondary intention, we anticipate enhanced wound bed preparation, optimizing conditions for graft take and minimizing the risk of graft failure. This waiting period also allows for a better assessment of the wound's healing potential and ensures that the tissue is sufficiently vascularized, reducing the likelihood of complications such as graft necrosis or infection. Therefore, the delayed grafting  is a strategic measure to enhance the long-term outcome of the reconstructive procedure.  The patient will follow up in: 2-3 weeks  Documentation: I have reviewed the above  documentation for accuracy and completeness, and I agree with the above.  RUFUS CHRISTELLA HOLY, MD

## 2024-02-17 ENCOUNTER — Encounter: Payer: Self-pay | Admitting: Dermatology

## 2024-02-23 ENCOUNTER — Encounter: Payer: Self-pay | Admitting: Dermatology

## 2024-02-23 ENCOUNTER — Ambulatory Visit: Admitting: Dermatology

## 2024-02-23 VITALS — BP 163/99 | HR 83

## 2024-02-23 DIAGNOSIS — C44311 Basal cell carcinoma of skin of nose: Secondary | ICD-10-CM

## 2024-02-23 DIAGNOSIS — T1490XD Injury, unspecified, subsequent encounter: Secondary | ICD-10-CM

## 2024-02-23 DIAGNOSIS — S0120XA Unspecified open wound of nose, initial encounter: Secondary | ICD-10-CM | POA: Diagnosis not present

## 2024-02-23 DIAGNOSIS — Z85828 Personal history of other malignant neoplasm of skin: Secondary | ICD-10-CM | POA: Diagnosis not present

## 2024-02-23 MED ORDER — MUPIROCIN 2 % EX OINT: 1.0000 | TOPICAL_OINTMENT | Freq: Two times a day (BID) | CUTANEOUS | 2 refills | Status: AC

## 2024-02-23 NOTE — Progress Notes (Signed)
   Follow Up Visit   Subjective  Gregory Valencia is a 58 y.o. male who presents for the following: Delayed Repair from Mohs Surgery  The patient presents for follow up from Mohs surgery for a BCC on the left tip of nose, treated on 02/09/2024, repaired with second intention with possible delayed repair. The patient has been bandaging the wound as directed with mupirocin  ointment. The endorse the following concerns: none.   The following portions of the chart were reviewed this encounter and updated as appropriate: medications, allergies, medical history  Review of Systems:  No other skin or systemic complaints except as noted in HPI or Assessment and Plan.  Objective  Well appearing patient in no apparent distress; mood and affect are within normal limits.  A focal examination was performed including the nose. All findings within normal limits unless otherwise noted below.  Healing wound with mild erythema  Relevant physical exam findings are noted in the Assessment and Plan.    Assessment & Plan   Healing Wound s/p Mohs for [facility] on the left tip of nose, treated on 02/09/2024, Healing by second intention - Reassured that wound is healing well - No evidence of infection - No swelling, induration, purulence, dehiscence, or tenderness out of proportion to the clinical exam, see photo above - Discussed the risks and benefits of performing a delayed graft vs letting the wound continue to heal by second intention. Patient made the decision to continue to let the wound heal on its own and reassess in two weeks.  - Ok to continue ointment daily to wound under a bandage for another 2 weeks.   Wound Bed Preparation  The patient is being prepared for a full-thickness skin graft procedure . The wound bed is thoroughly assessed and prepared to optimize graft take and healing. The necrotic tissue is debrided to create a clean, viable base, and any infected tissue is removed to reduce the risk of  graft failure. Hemostasis is achieved to prevent bleeding complications during the grafting process. The wound edges are carefully undermined, and the underlying tissues are inspected for adequate vascularity to support the graft. The wound is then thoroughly irrigated, and the area is prepped with antiseptic solutions to minimize the risk of infection. Once the wound bed is adequately prepared, it is covered with a moist dressing in preparation for the skin graft placement.  1.1 cm x 0.8 cm  Will delay graft further as lesion is healing well.   HISTORY OF BASAL CELL CARCINOMA OF THE SKIN - No evidence of recurrence today - Recommend regular full body skin exams - Recommend daily broad spectrum sunscreen SPF 30+ to sun-exposed areas, reapply every 2 hours as needed.  - Call if any new or changing lesions are noted between office visits  Return in about 2 weeks (around 03/08/2024) for Wound Check.  LILLETTE Rollene Gobble, RN, am acting as scribe for RUFUS CHRISTELLA HOLY, MD .   Documentation: I have reviewed the above documentation for accuracy and completeness, and I agree with the above.  RUFUS CHRISTELLA HOLY, MD

## 2024-02-23 NOTE — Patient Instructions (Signed)

## 2024-02-25 ENCOUNTER — Encounter: Payer: Self-pay | Admitting: Dermatology

## 2024-03-14 ENCOUNTER — Encounter: Payer: Self-pay | Admitting: Dermatology

## 2024-03-14 ENCOUNTER — Ambulatory Visit: Admitting: Dermatology

## 2024-03-14 DIAGNOSIS — W908XXA Exposure to other nonionizing radiation, initial encounter: Secondary | ICD-10-CM | POA: Diagnosis not present

## 2024-03-14 DIAGNOSIS — Z85828 Personal history of other malignant neoplasm of skin: Secondary | ICD-10-CM

## 2024-03-14 DIAGNOSIS — L905 Scar conditions and fibrosis of skin: Secondary | ICD-10-CM | POA: Diagnosis not present

## 2024-03-14 DIAGNOSIS — L578 Other skin changes due to chronic exposure to nonionizing radiation: Secondary | ICD-10-CM | POA: Diagnosis not present

## 2024-03-14 DIAGNOSIS — S0120XA Unspecified open wound of nose, initial encounter: Secondary | ICD-10-CM

## 2024-03-14 DIAGNOSIS — C44311 Basal cell carcinoma of skin of nose: Secondary | ICD-10-CM

## 2024-03-14 DIAGNOSIS — T1490XD Injury, unspecified, subsequent encounter: Secondary | ICD-10-CM

## 2024-03-14 NOTE — Progress Notes (Addendum)
 "  Follow Up Visit   Subjective  Gregory Valencia is a 58 y.o. male who presents for the following: follow up from Mohs surgery   The patient presents for follow up from Mohs surgery for a BCC on the left tip of nose, treated on 02/09/24, healing by 2nd intention. The patient has been bandaging the wound as directed. The endorse the following concerns: none  The following portions of the chart were reviewed this encounter and updated as appropriate: medications, allergies, medical history  Review of Systems:  No other skin or systemic complaints except as noted in HPI or Assessment and Plan.  Objective  Well appearing patient in no apparent distress; mood and affect are within normal limits.  A focal examination was performed including scalp, head, face. All findings within normal limits unless otherwise noted below.  Healing wound with mild erythema  Relevant physical exam findings are noted in the Assessment and Plan.    Assessment & Plan   Healing Wound s/p Mohs for [facility] on the nasal tip, treated on 02/09/24, repaired with 2nd intention - Reassured that wound is healing well - No evidence of infection - No swelling, induration, purulence, dehiscence, or tenderness out of proportion to the clinical exam, see photo above - Discussed that scars take up to 12 months to mature from the date of surgery - Recommend SPF 30+ to scar daily to prevent purple color from UV exposure during scar maturation process - Discussed that erythema and raised appearance of scar will fade over the next 4-6 months - OK to start scar massage at 4-6 weeks post-op - Can consider silicone based products for scar healing starting at 6 weeks post-op - Ok to continue ointment daily to wound under a bandage for another week - Will see back for consideration of delayed graft vs Dermabrasion at next visit  Wound Bed Preparation  The patient is being prepared for a delayed potential graft. The wound bed is thoroughly  assessed and prepared to optimize graft take and healing. The necrotic tissue is debrided with a curette to create a clean, viable base, and any infected tissue is removed to reduce the risk of graft failure. Hemostasis is achieved to prevent bleeding complications during the grafting process. The wound edges are carefully undermined, and the underlying tissues are inspected for adequate vascularity to support the graft. The wound is then thoroughly irrigated, and the area is prepped with antiseptic solutions to minimize the risk of infection. Once the wound bed is adequately prepared, it is covered with a moist dressing in preparation for the skin graft placement.   Wound size 0.8 x 0.5 cm  HISTORY OF BASAL CELL CARCINOMA OF THE SKIN - No evidence of recurrence today - Recommend regular full body skin exams - Recommend daily broad spectrum sunscreen SPF 30+ to sun-exposed areas, reapply every 2 hours as needed.  - Call if any new or changing lesions are noted between office visits  ACTINIC DAMAGE - chronic, secondary to cumulative UV radiation exposure/sun exposure over time - diffuse scaly erythematous macules with underlying dyspigmentation - Recommend daily broad spectrum sunscreen SPF 30+ to sun-exposed areas, reapply every 2 hours as needed.  - Recommend staying in the shade or wearing long sleeves, sun glasses (UVA+UVB protection) and wide brim hats (4-inch brim around the entire circumference of the hat). - Call for new or changing lesions.   On the date of service, I personally spent 30 minutes performing evaluation and management services, exclusive  of time spent on any separately reported procedure. This time included reviewing pertinent medical records and results, obtaining and reviewing the patients history, performing a medically appropriate evaluation, counseling and educating the patient regarding their medical conditions and treatment options, discussing risks and benefits,  coordinating care as appropriate, and documenting the encounter. This time supports the reported E&M level.  Return in about 3 weeks (around 04/04/2024) for wound check possible dermabrasion.  I, Darice Smock, CMA, am acting as scribe for Gregory CHRISTELLA HOLY, MD.   Documentation: I have reviewed the above documentation for accuracy and completeness, and I agree with the above.  Gregory CHRISTELLA HOLY, MD  "

## 2024-03-14 NOTE — Patient Instructions (Signed)

## 2024-04-04 ENCOUNTER — Ambulatory Visit: Admitting: Dermatology

## 2024-04-04 ENCOUNTER — Encounter: Payer: Self-pay | Admitting: Dermatology

## 2024-04-04 VITALS — BP 162/93 | HR 65

## 2024-04-04 DIAGNOSIS — C44311 Basal cell carcinoma of skin of nose: Secondary | ICD-10-CM

## 2024-04-04 DIAGNOSIS — L905 Scar conditions and fibrosis of skin: Secondary | ICD-10-CM | POA: Diagnosis not present

## 2024-04-04 DIAGNOSIS — L578 Other skin changes due to chronic exposure to nonionizing radiation: Secondary | ICD-10-CM | POA: Diagnosis not present

## 2024-04-04 DIAGNOSIS — W908XXA Exposure to other nonionizing radiation, initial encounter: Secondary | ICD-10-CM

## 2024-04-04 DIAGNOSIS — Z85828 Personal history of other malignant neoplasm of skin: Secondary | ICD-10-CM | POA: Diagnosis not present

## 2024-04-04 DIAGNOSIS — L539 Erythematous condition, unspecified: Secondary | ICD-10-CM

## 2024-04-04 NOTE — Patient Instructions (Signed)

## 2024-04-04 NOTE — Progress Notes (Addendum)
" ° °  Follow Up Visit   Subjective  Gregory Valencia is a 58 y.o. male who presents for the following: follow up from Mohs surgery   The patient presents for follow up from Mohs surgery for a BCC on the left tip of nose, treated on 02/09/24, healing by 2nd intention. The patient has been bandaging the wound as directed. The endorse the following concerns: dark purple color of the scar.   The following portions of the chart were reviewed this encounter and updated as appropriate: medications, allergies, medical history  Review of Systems:  No other skin or systemic complaints except as noted in HPI or Assessment and Plan.  Objective  Well appearing patient in no apparent distress; mood and affect are within normal limits.  A focal examination was performed including scalp, head, face. All findings within normal limits unless otherwise noted below.  Healing wound with mild erythema  Relevant physical exam findings are noted in the Assessment and Plan.    Assessment & Plan   Scar s/p Mohs for [facility] on the nasal tip, treated on 02/09/24, repaired with 2nd intention - Reassured that wound is healing well - No evidence of infection - No swelling, induration, purulence, dehiscence, or tenderness out of proportion to the clinical exam, see photo above - Discussed that scars take up to 12 months to mature from the date of surgery - Recommend SPF 30+ to scar daily to prevent purple color from UV exposure during scar maturation process - Discussed that erythema and raised appearance of scar will fade over the next 4-6 months - OK to start scar massage at 4-6 weeks post-op - Can consider silicone based products for scar healing starting at 6 weeks post-op - Ok to discontinue ointment - Discussed dermabrasion in the future if needed.   HISTORY OF BASAL CELL CARCINOMA OF THE SKIN - No evidence of recurrence today - Recommend regular full body skin exams - Recommend daily broad spectrum sunscreen SPF 30+  to sun-exposed areas, reapply every 2 hours as needed.  - Call if any new or changing lesions are noted between office visits  ACTINIC DAMAGE - chronic, secondary to cumulative UV radiation exposure/sun exposure over time - diffuse scaly erythematous macules with underlying dyspigmentation - Recommend daily broad spectrum sunscreen SPF 30+ to sun-exposed areas, reapply every 2 hours as needed.  - Recommend staying in the shade or wearing long sleeves, sun glasses (UVA+UVB protection) and wide brim hats (4-inch brim around the entire circumference of the hat). - Call for new or changing lesions.   Return for 4-6 month Follow Up.  I, Rollene Gobble, RN, am acting as scribe for RUFUS CHRISTELLA HOLY, MD .   We spent 30 min reviewing records, taking the patient history, providing face to face care with the patient, sending prescriptions.    Documentation: I have reviewed the above documentation for accuracy and completeness, and I agree with the above.  RUFUS CHRISTELLA HOLY, MD  "

## 2024-05-06 DIAGNOSIS — G4733 Obstructive sleep apnea (adult) (pediatric): Secondary | ICD-10-CM | POA: Diagnosis not present

## 2024-05-31 ENCOUNTER — Other Ambulatory Visit: Payer: Self-pay | Admitting: Family Medicine

## 2024-07-21 ENCOUNTER — Encounter: Admitting: Family Medicine

## 2024-07-25 ENCOUNTER — Encounter: Admitting: Dermatology

## 2024-07-25 ENCOUNTER — Encounter: Payer: BC Managed Care – PPO | Admitting: Dermatology

## 2024-08-03 ENCOUNTER — Ambulatory Visit: Admitting: Dermatology
